# Patient Record
Sex: Female | Born: 1935 | Race: Black or African American | Hispanic: No | Smoking: Never smoker
Health system: Southern US, Community
[De-identification: ages and names within clinical notes are randomized; demographics above are authoritative.]

## PROBLEM LIST (undated history)

## (undated) DIAGNOSIS — E785 Hyperlipidemia, unspecified: Secondary | ICD-10-CM

## (undated) DIAGNOSIS — I440 Atrioventricular block, first degree: Secondary | ICD-10-CM

## (undated) DIAGNOSIS — K37 Unspecified appendicitis: Secondary | ICD-10-CM

## (undated) DIAGNOSIS — F32A Depression, unspecified: Secondary | ICD-10-CM

## (undated) DIAGNOSIS — I4892 Unspecified atrial flutter: Secondary | ICD-10-CM

## (undated) DIAGNOSIS — I495 Sick sinus syndrome: Secondary | ICD-10-CM

## (undated) DIAGNOSIS — J449 Chronic obstructive pulmonary disease, unspecified: Secondary | ICD-10-CM

## (undated) DIAGNOSIS — F0390 Unspecified dementia without behavioral disturbance: Secondary | ICD-10-CM

## (undated) DIAGNOSIS — I509 Heart failure, unspecified: Secondary | ICD-10-CM

## (undated) DIAGNOSIS — D649 Anemia, unspecified: Secondary | ICD-10-CM

## (undated) DIAGNOSIS — I10 Essential (primary) hypertension: Secondary | ICD-10-CM

## (undated) DIAGNOSIS — M069 Rheumatoid arthritis, unspecified: Secondary | ICD-10-CM

## (undated) DIAGNOSIS — N390 Urinary tract infection, site not specified: Secondary | ICD-10-CM

## (undated) DIAGNOSIS — F329 Major depressive disorder, single episode, unspecified: Secondary | ICD-10-CM

## (undated) HISTORY — PX: PEG PLACEMENT: SHX5437

## (undated) HISTORY — PX: OTHER SURGICAL HISTORY: SHX169

---

## 2014-10-08 ENCOUNTER — Ambulatory Visit: Payer: Self-pay | Admitting: Family Medicine

## 2014-10-08 LAB — BASIC METABOLIC PANEL
ANION GAP: 6 — AB (ref 7–16)
BUN: 29 mg/dL — ABNORMAL HIGH (ref 7–18)
CHLORIDE: 103 mmol/L (ref 98–107)
CO2: 31 mmol/L (ref 21–32)
Calcium, Total: 9.6 mg/dL (ref 8.5–10.1)
Creatinine: 1.61 mg/dL — ABNORMAL HIGH (ref 0.60–1.30)
EGFR (Non-African Amer.): 32 — ABNORMAL LOW
GFR CALC AF AMER: 39 — AB
Glucose: 102 mg/dL — ABNORMAL HIGH (ref 65–99)
OSMOLALITY: 285 (ref 275–301)
Potassium: 4 mmol/L (ref 3.5–5.1)
Sodium: 140 mmol/L (ref 136–145)

## 2014-10-08 LAB — CBC WITH DIFFERENTIAL/PLATELET
HCT: 36.8 % (ref 35.0–47.0)
HGB: 11.9 g/dL — AB (ref 12.0–16.0)
MCH: 28.7 pg (ref 26.0–34.0)
MCHC: 32.3 g/dL (ref 32.0–36.0)
MCV: 89 fL (ref 80–100)
PLATELETS: 212 10*3/uL (ref 150–440)
RBC: 4.14 10*6/uL (ref 3.80–5.20)
RDW: 15.8 % — AB (ref 11.5–14.5)
WBC: 16.5 10*3/uL — ABNORMAL HIGH (ref 3.6–11.0)

## 2015-01-14 ENCOUNTER — Ambulatory Visit: Payer: Self-pay

## 2015-01-14 LAB — BASIC METABOLIC PANEL
Anion Gap: 11 (ref 7–16)
BUN: 18 mg/dL (ref 7–18)
CALCIUM: 10.4 mg/dL — AB (ref 8.5–10.1)
CO2: 27 mmol/L (ref 21–32)
Chloride: 98 mmol/L (ref 98–107)
Creatinine: 1.45 mg/dL — ABNORMAL HIGH (ref 0.60–1.30)
EGFR (African American): 44 — ABNORMAL LOW
EGFR (Non-African Amer.): 36 — ABNORMAL LOW
Glucose: 123 mg/dL — ABNORMAL HIGH (ref 65–99)
Osmolality: 275 (ref 275–301)
Potassium: 3.7 mmol/L (ref 3.5–5.1)
Sodium: 136 mmol/L (ref 136–145)

## 2015-01-14 LAB — URINALYSIS, COMPLETE
BACTERIA: NONE SEEN
BLOOD: NEGATIVE
Bilirubin,UR: NEGATIVE
Glucose,UR: NEGATIVE mg/dL (ref 0–75)
Hyaline Cast: 3
Ketone: NEGATIVE
Leukocyte Esterase: NEGATIVE
Nitrite: NEGATIVE
Ph: 5 (ref 4.5–8.0)
Protein: 30
Specific Gravity: 1.014 (ref 1.003–1.030)
WBC UR: 1 /HPF (ref 0–5)

## 2015-01-16 LAB — URINE CULTURE

## 2015-01-20 ENCOUNTER — Ambulatory Visit: Payer: Self-pay

## 2015-01-20 ENCOUNTER — Emergency Department: Payer: Self-pay | Admitting: Student

## 2015-01-20 ENCOUNTER — Ambulatory Visit: Payer: Self-pay | Admitting: Cardiology

## 2015-01-20 LAB — CBC WITH DIFFERENTIAL/PLATELET
Basophil #: 0.1 10*3/uL (ref 0.0–0.1)
Basophil %: 0.5 %
Eosinophil #: 0 10*3/uL (ref 0.0–0.7)
Eosinophil %: 0.2 %
HCT: 43.6 % (ref 35.0–47.0)
HGB: 14 g/dL (ref 12.0–16.0)
Lymphocyte #: 1.3 10*3/uL (ref 1.0–3.6)
Lymphocyte %: 9.1 %
MCH: 28.1 pg (ref 26.0–34.0)
MCHC: 32.1 g/dL (ref 32.0–36.0)
MCV: 88 fL (ref 80–100)
Monocyte #: 0.9 x10 3/mm (ref 0.2–0.9)
Monocyte %: 6 %
NEUTROS PCT: 84.2 %
Neutrophil #: 12.2 10*3/uL — ABNORMAL HIGH (ref 1.4–6.5)
Platelet: 218 10*3/uL (ref 150–440)
RBC: 4.98 10*6/uL (ref 3.80–5.20)
RDW: 16.6 % — ABNORMAL HIGH (ref 11.5–14.5)
WBC: 14.4 10*3/uL — ABNORMAL HIGH (ref 3.6–11.0)

## 2015-01-20 LAB — BASIC METABOLIC PANEL
Anion Gap: 10 (ref 7–16)
BUN: 17 mg/dL (ref 7–18)
Calcium, Total: 9 mg/dL (ref 8.5–10.1)
Chloride: 100 mmol/L (ref 98–107)
Co2: 29 mmol/L (ref 21–32)
Creatinine: 1.28 mg/dL (ref 0.60–1.30)
GFR CALC AF AMER: 51 — AB
GFR CALC NON AF AMER: 42 — AB
Glucose: 126 mg/dL — ABNORMAL HIGH (ref 65–99)
Osmolality: 281 (ref 275–301)
Potassium: 3.7 mmol/L (ref 3.5–5.1)
Sodium: 139 mmol/L (ref 136–145)

## 2015-02-18 ENCOUNTER — Other Ambulatory Visit: Payer: Self-pay

## 2015-02-19 ENCOUNTER — Other Ambulatory Visit: Payer: Self-pay

## 2015-03-16 ENCOUNTER — Other Ambulatory Visit: Admit: 2015-03-16 | Disposition: A | Discharge status: Home / Self Care | Payer: Self-pay

## 2015-03-22 LAB — WOUND CULTURE

## 2015-03-28 ENCOUNTER — Other Ambulatory Visit: Admit: 2015-03-28 | Disposition: A | Discharge status: Home / Self Care | Payer: Self-pay

## 2015-03-28 LAB — BASIC METABOLIC PANEL
Anion Gap: 7 (ref 7–16)
BUN: 17 mg/dL
CREATININE: 0.77 mg/dL
Calcium, Total: 9.2 mg/dL
Chloride: 100 mmol/L — ABNORMAL LOW
Co2: 25 mmol/L
EGFR (African American): >60
EGFR (Non-African Amer.): >60
Glucose: 137 mg/dL — ABNORMAL HIGH
POTASSIUM: 3.9 mmol/L
Sodium: 132 mmol/L — ABNORMAL LOW

## 2015-03-28 LAB — HEPATIC FUNCTION PANEL A (ARMC)
ALT: 15 U/L
Albumin: 3.7 g/dL
Alkaline Phosphatase: 76 U/L
BILIRUBIN DIRECT: 0.2 mg/dL
Bilirubin,Total: 0.3 mg/dL
Indirect Bilirubin: 0.1
SGOT(AST): 20 U/L
Total Protein: 7.7 g/dL

## 2015-03-28 LAB — LIPASE, BLOOD: Lipase: 26 U/L

## 2015-03-28 LAB — AMYLASE: AMYLASE: 46 U/L

## 2015-03-29 ENCOUNTER — Other Ambulatory Visit: Admit: 2015-03-29 | Disposition: A | Discharge status: Home / Self Care | Payer: Self-pay

## 2015-03-29 LAB — CBC WITH DIFFERENTIAL/PLATELET
Basophil #: 0.1 10*3/uL (ref 0.0–0.1)
Basophil %: 1.1 %
Eosinophil #: 0.3 10*3/uL (ref 0.0–0.7)
Eosinophil %: 2.3 %
HCT: 40.9 % (ref 35.0–47.0)
HGB: 13.1 g/dL (ref 12.0–16.0)
LYMPHS ABS: 2.2 10*3/uL (ref 1.0–3.6)
Lymphocyte %: 16.8 %
MCH: 28.5 pg (ref 26.0–34.0)
MCHC: 32 g/dL (ref 32.0–36.0)
MCV: 89 fL (ref 80–100)
MONO ABS: 0.9 x10 3/mm (ref 0.2–0.9)
MONOS PCT: 6.8 %
Neutrophil #: 9.5 10*3/uL — ABNORMAL HIGH (ref 1.4–6.5)
Neutrophil %: 73 %
PLATELETS: 290 10*3/uL (ref 150–440)
RBC: 4.59 10*6/uL (ref 3.80–5.20)
RDW: 19 % — AB (ref 11.5–14.5)
WBC: 13 10*3/uL — ABNORMAL HIGH (ref 3.6–11.0)

## 2015-03-29 LAB — AMYLASE: AMYLASE: 33 U/L

## 2015-03-29 LAB — LIPASE, BLOOD: LIPASE: 23 U/L

## 2015-04-16 ENCOUNTER — Other Ambulatory Visit
Admission: RE | Admit: 2015-04-16 | Discharge: 2015-04-16 | Disposition: A | Discharge status: Home / Self Care | Payer: Medicare Other | Source: Skilled Nursing Facility | Attending: Nurse Practitioner | Admitting: Nurse Practitioner

## 2015-04-16 DIAGNOSIS — D72829 Elevated white blood cell count, unspecified: Secondary | ICD-10-CM | POA: Diagnosis present

## 2015-04-16 LAB — URINALYSIS COMPLETE WITH MICROSCOPIC (ARMC ONLY)
BILIRUBIN URINE: NEGATIVE
Bacteria, UA: NONE SEEN
Glucose, UA: NEGATIVE mg/dL
Hgb urine dipstick: NEGATIVE
KETONES UR: NEGATIVE mg/dL
Leukocytes, UA: NEGATIVE
Nitrite: NEGATIVE
PROTEIN: NEGATIVE mg/dL
RBC / HPF: NONE SEEN RBC/hpf (ref 0–5)
SPECIFIC GRAVITY, URINE: 1.008 (ref 1.005–1.030)
pH: 6 (ref 5.0–8.0)

## 2015-04-18 LAB — URINE CULTURE: CULTURE: NO GROWTH

## 2015-05-19 ENCOUNTER — Other Ambulatory Visit
Admission: RE | Admit: 2015-05-19 | Discharge: 2015-05-19 | Disposition: A | Discharge status: Home / Self Care | Payer: Medicare Other | Source: Skilled Nursing Facility | Attending: Family Medicine | Admitting: Family Medicine

## 2015-05-19 DIAGNOSIS — D72829 Elevated white blood cell count, unspecified: Secondary | ICD-10-CM | POA: Insufficient documentation

## 2015-05-19 LAB — URINALYSIS COMPLETE WITH MICROSCOPIC (ARMC ONLY)
Bilirubin Urine: NEGATIVE
Glucose, UA: NEGATIVE mg/dL
Hgb urine dipstick: NEGATIVE
Ketones, ur: NEGATIVE mg/dL
Leukocytes, UA: NEGATIVE
Nitrite: NEGATIVE
PH: 8 (ref 5.0–8.0)
PROTEIN: NEGATIVE mg/dL
Specific Gravity, Urine: 1.017 (ref 1.005–1.030)

## 2015-05-21 LAB — URINE CULTURE

## 2015-05-31 ENCOUNTER — Other Ambulatory Visit
Admission: RE | Admit: 2015-05-31 | Discharge: 2015-05-31 | Disposition: A | Discharge status: Home / Self Care | Payer: Medicare Other | Source: Other Acute Inpatient Hospital | Attending: Family Medicine | Admitting: Family Medicine

## 2015-05-31 ENCOUNTER — Other Ambulatory Visit
Admission: RE | Admit: 2015-05-31 | Discharge: 2015-05-31 | Disposition: A | Discharge status: Home / Self Care | Payer: Medicare Other | Source: Ambulatory Visit | Attending: Family Medicine | Admitting: Family Medicine

## 2015-05-31 DIAGNOSIS — D72829 Elevated white blood cell count, unspecified: Secondary | ICD-10-CM | POA: Insufficient documentation

## 2015-05-31 LAB — CBC WITH DIFFERENTIAL/PLATELET
BASOS PCT: 0 %
Basophils Absolute: 0 10*3/uL (ref 0–0.1)
Eosinophils Absolute: 0.1 10*3/uL (ref 0–0.7)
Eosinophils Relative: 0 %
HCT: 40.5 % (ref 35.0–47.0)
HEMOGLOBIN: 12.8 g/dL (ref 12.0–16.0)
Lymphocytes Relative: 9 %
Lymphs Abs: 1.2 10*3/uL (ref 1.0–3.6)
MCH: 27 pg (ref 26.0–34.0)
MCHC: 31.7 g/dL — AB (ref 32.0–36.0)
MCV: 85.1 fL (ref 80.0–100.0)
Monocytes Absolute: 1 10*3/uL — ABNORMAL HIGH (ref 0.2–0.9)
Monocytes Relative: 7 %
NEUTROS ABS: 12.4 10*3/uL — AB (ref 1.4–6.5)
NEUTROS PCT: 84 %
PLATELETS: 287 10*3/uL (ref 150–440)
RBC: 4.76 MIL/uL (ref 3.80–5.20)
RDW: 17.4 % — AB (ref 11.5–14.5)
WBC: 14.7 10*3/uL — ABNORMAL HIGH (ref 3.6–11.0)

## 2015-05-31 LAB — URINALYSIS COMPLETE WITH MICROSCOPIC (ARMC ONLY)
Bacteria, UA: NONE SEEN
Bilirubin Urine: NEGATIVE
Glucose, UA: NEGATIVE mg/dL
HGB URINE DIPSTICK: NEGATIVE
KETONES UR: NEGATIVE mg/dL
Leukocytes, UA: NEGATIVE
Nitrite: NEGATIVE
PH: 8 (ref 5.0–8.0)
Protein, ur: 100 mg/dL — AB
Specific Gravity, Urine: 1.01 (ref 1.005–1.030)

## 2015-05-31 LAB — BASIC METABOLIC PANEL
Anion gap: 9 (ref 5–15)
BUN: 23 mg/dL — AB (ref 6–20)
CO2: 23 mmol/L (ref 22–32)
Calcium: 9.2 mg/dL (ref 8.9–10.3)
Chloride: 103 mmol/L (ref 101–111)
Creatinine, Ser: 0.71 mg/dL (ref 0.44–1.00)
GFR calc non Af Amer: >60 mL/min (ref >60)
Glucose, Bld: 132 mg/dL — ABNORMAL HIGH (ref 65–99)
POTASSIUM: 4.4 mmol/L (ref 3.5–5.1)
SODIUM: 135 mmol/L (ref 135–145)

## 2015-06-02 LAB — URINE CULTURE: Culture: >=100000

## 2015-09-05 ENCOUNTER — Other Ambulatory Visit
Admission: RE | Admit: 2015-09-05 | Discharge: 2015-09-05 | Disposition: A | Discharge status: Home / Self Care | Payer: Medicare Other | Source: Skilled Nursing Facility | Attending: General Surgery | Admitting: General Surgery

## 2015-09-05 DIAGNOSIS — R000 Tachycardia, unspecified: Secondary | ICD-10-CM | POA: Insufficient documentation

## 2015-09-05 DIAGNOSIS — R509 Fever, unspecified: Secondary | ICD-10-CM | POA: Insufficient documentation

## 2015-09-05 LAB — URINALYSIS COMPLETE WITH MICROSCOPIC (ARMC ONLY)
Bilirubin Urine: NEGATIVE
Glucose, UA: NEGATIVE mg/dL
Hgb urine dipstick: NEGATIVE
Ketones, ur: NEGATIVE mg/dL
Nitrite: NEGATIVE
PH: 5 (ref 5.0–8.0)
PROTEIN: NEGATIVE mg/dL
Specific Gravity, Urine: 1.009 (ref 1.005–1.030)

## 2015-09-07 ENCOUNTER — Other Ambulatory Visit
Admission: RE | Admit: 2015-09-07 | Discharge: 2015-09-07 | Disposition: A | Discharge status: Home / Self Care | Payer: Medicare Other | Source: Ambulatory Visit | Attending: Family Medicine | Admitting: Family Medicine

## 2015-09-07 DIAGNOSIS — K5669 Other intestinal obstruction: Secondary | ICD-10-CM | POA: Insufficient documentation

## 2015-09-07 DIAGNOSIS — K567 Ileus, unspecified: Secondary | ICD-10-CM | POA: Diagnosis present

## 2015-09-07 DIAGNOSIS — K5660 Unspecified intestinal obstruction: Secondary | ICD-10-CM | POA: Insufficient documentation

## 2015-09-07 DIAGNOSIS — I495 Sick sinus syndrome: Secondary | ICD-10-CM | POA: Diagnosis not present

## 2015-09-07 LAB — COMPREHENSIVE METABOLIC PANEL
ALT: 28 U/L (ref 14–54)
ANION GAP: 12 (ref 5–15)
AST: 35 U/L (ref 15–41)
Albumin: 3.3 g/dL — ABNORMAL LOW (ref 3.5–5.0)
Alkaline Phosphatase: 88 U/L (ref 38–126)
BUN: 34 mg/dL — ABNORMAL HIGH (ref 6–20)
CHLORIDE: 97 mmol/L — AB (ref 101–111)
CO2: 24 mmol/L (ref 22–32)
Calcium: 9.1 mg/dL (ref 8.9–10.3)
Creatinine, Ser: 0.78 mg/dL (ref 0.44–1.00)
GFR calc non Af Amer: >60 mL/min (ref >60)
Glucose, Bld: 107 mg/dL — ABNORMAL HIGH (ref 65–99)
POTASSIUM: 4.7 mmol/L (ref 3.5–5.1)
SODIUM: 133 mmol/L — AB (ref 135–145)
Total Bilirubin: 0.9 mg/dL (ref 0.3–1.2)
Total Protein: 7.6 g/dL (ref 6.5–8.1)

## 2015-09-07 LAB — URINE CULTURE: Culture: >=100000

## 2015-11-17 ENCOUNTER — Other Ambulatory Visit
Admission: RE | Admit: 2015-11-17 | Discharge: 2015-11-17 | Disposition: A | Discharge status: Home / Self Care | Payer: Medicare Other | Source: Skilled Nursing Facility | Attending: Family Medicine | Admitting: Family Medicine

## 2015-11-17 DIAGNOSIS — Z029 Encounter for administrative examinations, unspecified: Secondary | ICD-10-CM | POA: Diagnosis present

## 2015-11-17 LAB — BASIC METABOLIC PANEL
Anion gap: 9 (ref 5–15)
BUN: 33 mg/dL — ABNORMAL HIGH (ref 6–20)
CO2: 29 mmol/L (ref 22–32)
CREATININE: 0.84 mg/dL (ref 0.44–1.00)
Calcium: 9.1 mg/dL (ref 8.9–10.3)
Chloride: 96 mmol/L — ABNORMAL LOW (ref 101–111)
GFR calc Af Amer: >60 mL/min (ref >60)
GFR calc non Af Amer: >60 mL/min (ref >60)
Glucose, Bld: 163 mg/dL — ABNORMAL HIGH (ref 65–99)
Potassium: 3.5 mmol/L (ref 3.5–5.1)
Sodium: 134 mmol/L — ABNORMAL LOW (ref 135–145)

## 2015-11-17 LAB — CBC WITH DIFFERENTIAL/PLATELET
BASOS PCT: 1 %
Basophils Absolute: 0.1 10*3/uL (ref 0–0.1)
EOS PCT: 1 %
Eosinophils Absolute: 0.1 10*3/uL (ref 0–0.7)
HEMATOCRIT: 42.6 % (ref 35.0–47.0)
Hemoglobin: 13.9 g/dL (ref 12.0–16.0)
Lymphocytes Relative: 9 %
Lymphs Abs: 1 10*3/uL (ref 1.0–3.6)
MCH: 27.5 pg (ref 26.0–34.0)
MCHC: 32.6 g/dL (ref 32.0–36.0)
MCV: 84.5 fL (ref 80.0–100.0)
MONO ABS: 0.9 10*3/uL (ref 0.2–0.9)
MONOS PCT: 8 %
NEUTROS PCT: 81 %
Neutro Abs: 9.3 10*3/uL — ABNORMAL HIGH (ref 1.4–6.5)
Platelets: 270 10*3/uL (ref 150–440)
RBC: 5.04 MIL/uL (ref 3.80–5.20)
RDW: 16.5 % — AB (ref 11.5–14.5)
WBC: 11.3 10*3/uL — ABNORMAL HIGH (ref 3.6–11.0)

## 2016-02-01 ENCOUNTER — Other Ambulatory Visit
Admission: RE | Admit: 2016-02-01 | Discharge: 2016-02-01 | Disposition: A | Discharge status: Home / Self Care | Payer: Medicare Other | Source: Ambulatory Visit | Attending: Family Medicine | Admitting: Family Medicine

## 2016-02-01 DIAGNOSIS — R112 Nausea with vomiting, unspecified: Secondary | ICD-10-CM | POA: Insufficient documentation

## 2016-02-01 LAB — URINALYSIS COMPLETE WITH MICROSCOPIC (ARMC ONLY)
BILIRUBIN URINE: NEGATIVE
GLUCOSE, UA: NEGATIVE mg/dL
Hgb urine dipstick: NEGATIVE
Ketones, ur: NEGATIVE mg/dL
Nitrite: NEGATIVE
Protein, ur: 100 mg/dL — AB
SPECIFIC GRAVITY, URINE: 1.021 (ref 1.005–1.030)
pH: 6 (ref 5.0–8.0)

## 2016-02-01 LAB — COMPREHENSIVE METABOLIC PANEL
ALK PHOS: 104 U/L (ref 38–126)
ALT: 36 U/L (ref 14–54)
ANION GAP: 7 (ref 5–15)
AST: 33 U/L (ref 15–41)
Albumin: 3.2 g/dL — ABNORMAL LOW (ref 3.5–5.0)
BILIRUBIN TOTAL: 0.8 mg/dL (ref 0.3–1.2)
BUN: 28 mg/dL — ABNORMAL HIGH (ref 6–20)
CALCIUM: 8.8 mg/dL — AB (ref 8.9–10.3)
CO2: 25 mmol/L (ref 22–32)
Chloride: 102 mmol/L (ref 101–111)
Creatinine, Ser: 0.74 mg/dL (ref 0.44–1.00)
GFR calc non Af Amer: >60 mL/min (ref >60)
GLUCOSE: 151 mg/dL — AB (ref 65–99)
POTASSIUM: 4 mmol/L (ref 3.5–5.1)
SODIUM: 134 mmol/L — AB (ref 135–145)
TOTAL PROTEIN: 7.5 g/dL (ref 6.5–8.1)

## 2016-02-01 LAB — CBC WITH DIFFERENTIAL/PLATELET
Basophils Absolute: 0 10*3/uL (ref 0–0.1)
Basophils Relative: 0 %
EOS ABS: 0 10*3/uL (ref 0–0.7)
EOS PCT: 0 %
HCT: 45.5 % (ref 35.0–47.0)
Hemoglobin: 15 g/dL (ref 12.0–16.0)
LYMPHS ABS: 1.1 10*3/uL (ref 1.0–3.6)
Lymphocytes Relative: 8 %
MCH: 27.7 pg (ref 26.0–34.0)
MCHC: 33 g/dL (ref 32.0–36.0)
MCV: 84 fL (ref 80.0–100.0)
MONO ABS: 1 10*3/uL — AB (ref 0.2–0.9)
Monocytes Relative: 7 %
Neutro Abs: 12.7 10*3/uL — ABNORMAL HIGH (ref 1.4–6.5)
Neutrophils Relative %: 85 %
PLATELETS: 245 10*3/uL (ref 150–440)
RBC: 5.42 MIL/uL — ABNORMAL HIGH (ref 3.80–5.20)
RDW: 17.7 % — ABNORMAL HIGH (ref 11.5–14.5)
WBC: 14.9 10*3/uL — ABNORMAL HIGH (ref 3.6–11.0)

## 2016-02-05 LAB — URINE CULTURE: SPECIAL REQUESTS: NORMAL

## 2016-05-15 ENCOUNTER — Emergency Department: Payer: Medicare Other

## 2016-05-15 ENCOUNTER — Encounter: Payer: Self-pay | Admitting: Internal Medicine

## 2016-05-15 ENCOUNTER — Inpatient Hospital Stay
Admission: EM | Admit: 2016-05-15 | Discharge: 2016-06-12 | DRG: 268 | Disposition: E | Payer: Medicare Other | Attending: Internal Medicine | Admitting: Internal Medicine

## 2016-05-15 DIAGNOSIS — Z66 Do not resuscitate: Secondary | ICD-10-CM | POA: Diagnosis present

## 2016-05-15 DIAGNOSIS — E876 Hypokalemia: Secondary | ICD-10-CM | POA: Diagnosis not present

## 2016-05-15 DIAGNOSIS — I248 Other forms of acute ischemic heart disease: Secondary | ICD-10-CM | POA: Diagnosis present

## 2016-05-15 DIAGNOSIS — L8990 Pressure ulcer of unspecified site, unspecified stage: Secondary | ICD-10-CM | POA: Insufficient documentation

## 2016-05-15 DIAGNOSIS — M069 Rheumatoid arthritis, unspecified: Secondary | ICD-10-CM | POA: Diagnosis present

## 2016-05-15 DIAGNOSIS — R112 Nausea with vomiting, unspecified: Secondary | ICD-10-CM | POA: Diagnosis not present

## 2016-05-15 DIAGNOSIS — Z7952 Long term (current) use of systemic steroids: Secondary | ICD-10-CM

## 2016-05-15 DIAGNOSIS — K254 Chronic or unspecified gastric ulcer with hemorrhage: Secondary | ICD-10-CM | POA: Diagnosis present

## 2016-05-15 DIAGNOSIS — Z931 Gastrostomy status: Secondary | ICD-10-CM | POA: Diagnosis not present

## 2016-05-15 DIAGNOSIS — N179 Acute kidney failure, unspecified: Secondary | ICD-10-CM | POA: Diagnosis present

## 2016-05-15 DIAGNOSIS — R059 Cough, unspecified: Secondary | ICD-10-CM

## 2016-05-15 DIAGNOSIS — J449 Chronic obstructive pulmonary disease, unspecified: Secondary | ICD-10-CM | POA: Diagnosis present

## 2016-05-15 DIAGNOSIS — E1122 Type 2 diabetes mellitus with diabetic chronic kidney disease: Secondary | ICD-10-CM | POA: Diagnosis present

## 2016-05-15 DIAGNOSIS — I713 Abdominal aortic aneurysm, ruptured: Secondary | ICD-10-CM | POA: Diagnosis present

## 2016-05-15 DIAGNOSIS — N183 Chronic kidney disease, stage 3 (moderate): Secondary | ICD-10-CM | POA: Diagnosis present

## 2016-05-15 DIAGNOSIS — E440 Moderate protein-calorie malnutrition: Secondary | ICD-10-CM

## 2016-05-15 DIAGNOSIS — R109 Unspecified abdominal pain: Secondary | ICD-10-CM

## 2016-05-15 DIAGNOSIS — I509 Heart failure, unspecified: Secondary | ICD-10-CM | POA: Diagnosis present

## 2016-05-15 DIAGNOSIS — D62 Acute posthemorrhagic anemia: Secondary | ICD-10-CM | POA: Diagnosis present

## 2016-05-15 DIAGNOSIS — I130 Hypertensive heart and chronic kidney disease with heart failure and stage 1 through stage 4 chronic kidney disease, or unspecified chronic kidney disease: Secondary | ICD-10-CM | POA: Diagnosis present

## 2016-05-15 DIAGNOSIS — R1310 Dysphagia, unspecified: Secondary | ICD-10-CM | POA: Diagnosis present

## 2016-05-15 DIAGNOSIS — K567 Ileus, unspecified: Secondary | ICD-10-CM | POA: Diagnosis not present

## 2016-05-15 DIAGNOSIS — I959 Hypotension, unspecified: Secondary | ICD-10-CM | POA: Diagnosis not present

## 2016-05-15 DIAGNOSIS — K920 Hematemesis: Secondary | ICD-10-CM

## 2016-05-15 DIAGNOSIS — Z79899 Other long term (current) drug therapy: Secondary | ICD-10-CM

## 2016-05-15 DIAGNOSIS — Z87440 Personal history of urinary (tract) infections: Secondary | ICD-10-CM

## 2016-05-15 DIAGNOSIS — R05 Cough: Secondary | ICD-10-CM

## 2016-05-15 DIAGNOSIS — I4892 Unspecified atrial flutter: Secondary | ICD-10-CM | POA: Diagnosis present

## 2016-05-15 DIAGNOSIS — K922 Gastrointestinal hemorrhage, unspecified: Secondary | ICD-10-CM | POA: Diagnosis present

## 2016-05-15 DIAGNOSIS — K2971 Gastritis, unspecified, with bleeding: Secondary | ICD-10-CM | POA: Diagnosis present

## 2016-05-15 DIAGNOSIS — E785 Hyperlipidemia, unspecified: Secondary | ICD-10-CM | POA: Diagnosis present

## 2016-05-15 DIAGNOSIS — Z7401 Bed confinement status: Secondary | ICD-10-CM | POA: Diagnosis not present

## 2016-05-15 DIAGNOSIS — E860 Dehydration: Secondary | ICD-10-CM | POA: Diagnosis present

## 2016-05-15 DIAGNOSIS — D649 Anemia, unspecified: Secondary | ICD-10-CM | POA: Diagnosis not present

## 2016-05-15 DIAGNOSIS — R571 Hypovolemic shock: Secondary | ICD-10-CM | POA: Diagnosis present

## 2016-05-15 DIAGNOSIS — F0390 Unspecified dementia without behavioral disturbance: Secondary | ICD-10-CM | POA: Diagnosis present

## 2016-05-15 DIAGNOSIS — R140 Abdominal distension (gaseous): Secondary | ICD-10-CM

## 2016-05-15 DIAGNOSIS — E782 Mixed hyperlipidemia: Secondary | ICD-10-CM | POA: Diagnosis present

## 2016-05-15 DIAGNOSIS — A419 Sepsis, unspecified organism: Secondary | ICD-10-CM

## 2016-05-15 DIAGNOSIS — E872 Acidosis: Secondary | ICD-10-CM | POA: Diagnosis present

## 2016-05-15 HISTORY — DX: Unspecified dementia, unspecified severity, without behavioral disturbance, psychotic disturbance, mood disturbance, and anxiety: F03.90

## 2016-05-15 HISTORY — DX: Hyperlipidemia, unspecified: E78.5

## 2016-05-15 HISTORY — DX: Chronic obstructive pulmonary disease, unspecified: J44.9

## 2016-05-15 HISTORY — DX: Depression, unspecified: F32.A

## 2016-05-15 HISTORY — DX: Sick sinus syndrome: I49.5

## 2016-05-15 HISTORY — DX: Unspecified atrial flutter: I48.92

## 2016-05-15 HISTORY — DX: Atrioventricular block, first degree: I44.0

## 2016-05-15 HISTORY — DX: Urinary tract infection, site not specified: N39.0

## 2016-05-15 HISTORY — DX: Unspecified appendicitis: K37

## 2016-05-15 HISTORY — DX: Major depressive disorder, single episode, unspecified: F32.9

## 2016-05-15 HISTORY — DX: Rheumatoid arthritis, unspecified: M06.9

## 2016-05-15 HISTORY — DX: Essential (primary) hypertension: I10

## 2016-05-15 HISTORY — DX: Heart failure, unspecified: I50.9

## 2016-05-15 HISTORY — DX: Anemia, unspecified: D64.9

## 2016-05-15 LAB — COMPREHENSIVE METABOLIC PANEL
ALBUMIN: 2.9 g/dL — AB (ref 3.5–5.0)
ALK PHOS: 74 U/L (ref 38–126)
ALT: 17 U/L (ref 14–54)
ANION GAP: 16 — AB (ref 5–15)
AST: 30 U/L (ref 15–41)
BILIRUBIN TOTAL: 0.8 mg/dL (ref 0.3–1.2)
BUN: 53 mg/dL — ABNORMAL HIGH (ref 6–20)
CALCIUM: 9 mg/dL (ref 8.9–10.3)
CO2: 23 mmol/L (ref 22–32)
Chloride: 96 mmol/L — ABNORMAL LOW (ref 101–111)
Creatinine, Ser: 1.43 mg/dL — ABNORMAL HIGH (ref 0.44–1.00)
GFR, EST AFRICAN AMERICAN: 37 mL/min — AB (ref >60)
GFR, EST NON AFRICAN AMERICAN: 32 mL/min — AB (ref >60)
GLUCOSE: 171 mg/dL — AB (ref 65–99)
POTASSIUM: 4.2 mmol/L (ref 3.5–5.1)
Sodium: 135 mmol/L (ref 135–145)
TOTAL PROTEIN: 6.8 g/dL (ref 6.5–8.1)

## 2016-05-15 LAB — CBC WITH DIFFERENTIAL/PLATELET
Basophils Absolute: 0.1 10*3/uL (ref 0–0.1)
Eosinophils Absolute: 0 10*3/uL (ref 0–0.7)
Eosinophils Relative: 0 %
HEMATOCRIT: 32.4 % — AB (ref 35.0–47.0)
HEMOGLOBIN: 10.3 g/dL — AB (ref 12.0–16.0)
LYMPHS ABS: 0.8 10*3/uL — AB (ref 1.0–3.6)
MCH: 27.7 pg (ref 26.0–34.0)
MCHC: 31.8 g/dL — AB (ref 32.0–36.0)
MCV: 87 fL (ref 80.0–100.0)
MONO ABS: 1.4 10*3/uL — AB (ref 0.2–0.9)
NEUTROS ABS: 27.4 10*3/uL — AB (ref 1.4–6.5)
Neutrophils Relative %: 92 %
Platelets: 251 10*3/uL (ref 150–440)
RBC: 3.73 MIL/uL — ABNORMAL LOW (ref 3.80–5.20)
RDW: 18.3 % — AB (ref 11.5–14.5)
WBC: 29.6 10*3/uL — AB (ref 3.6–11.0)

## 2016-05-15 LAB — URINALYSIS COMPLETE WITH MICROSCOPIC (ARMC ONLY)
Bilirubin Urine: NEGATIVE
Glucose, UA: NEGATIVE mg/dL
Hgb urine dipstick: NEGATIVE
Ketones, ur: NEGATIVE mg/dL
Nitrite: NEGATIVE
PH: 5 (ref 5.0–8.0)
PROTEIN: 30 mg/dL — AB
Specific Gravity, Urine: 1.013 (ref 1.005–1.030)

## 2016-05-15 LAB — PROCALCITONIN: Procalcitonin: 2.71 ng/mL

## 2016-05-15 LAB — APTT: APTT: 25 s (ref 24–36)

## 2016-05-15 LAB — HEMOGLOBIN
Hemoglobin: 9.7 g/dL — ABNORMAL LOW (ref 12.0–16.0)
Hemoglobin: 8.5 g/dL — ABNORMAL LOW (ref 12.0–16.0)

## 2016-05-15 LAB — LACTIC ACID, PLASMA
Lactic Acid, Venous: 5.9 mmol/L (ref 0.5–2.0)
Lactic Acid, Venous: 5.1 mmol/L (ref 0.5–2.0)

## 2016-05-15 LAB — GLUCOSE, CAPILLARY: Glucose-Capillary: 109 mg/dL — ABNORMAL HIGH (ref 65–99)

## 2016-05-15 LAB — TROPONIN I
TROPONIN I: 0.11 ng/mL — AB (ref <0.031)
TROPONIN I: 0.29 ng/mL — AB (ref <0.031)

## 2016-05-15 LAB — PROTIME-INR
INR: 1.16
Prothrombin Time: 15 seconds (ref 11.4–15.0)

## 2016-05-15 LAB — MRSA PCR SCREENING: MRSA by PCR: NEGATIVE

## 2016-05-15 LAB — MAGNESIUM: Magnesium: 2.4 mg/dL (ref 1.7–2.4)

## 2016-05-15 MED ORDER — DIGOXIN 0.25 MG/ML IJ SOLN
0.2500 mg | Freq: Once | INTRAMUSCULAR | Status: AC
  Administered 2016-05-15: 0.25 mg via INTRAVENOUS
  Filled 2016-05-15: qty 2

## 2016-05-15 MED ORDER — SODIUM CHLORIDE 0.9 % IV BOLUS (SEPSIS)
500.0000 mL | Freq: Once | INTRAVENOUS | Status: AC
  Administered 2016-05-15: 500 mL via INTRAVENOUS

## 2016-05-15 MED ORDER — SODIUM CHLORIDE 0.9 % IV BOLUS (SEPSIS)
1000.0000 mL | Freq: Once | INTRAVENOUS | Status: AC
  Administered 2016-05-15: 1000 mL via INTRAVENOUS

## 2016-05-15 MED ORDER — ONDANSETRON HCL 4 MG/2ML IJ SOLN: 4.0000 mg | Freq: Four times a day (QID) | INTRAMUSCULAR | Status: DC | PRN

## 2016-05-15 MED ORDER — PIPERACILLIN-TAZOBACTAM 3.375 G IVPB
3.3750 g | Freq: Three times a day (TID) | INTRAVENOUS | Status: DC
  Administered 2016-05-15 – 2016-05-22 (×20): 3.375 g via INTRAVENOUS
  Filled 2016-05-15 (×26): qty 50

## 2016-05-15 MED ORDER — METOPROLOL TARTRATE 25 MG PO TABS
12.5000 mg | ORAL_TABLET | Freq: Three times a day (TID) | ORAL | Status: DC
  Filled 2016-05-15: qty 1

## 2016-05-15 MED ORDER — VANCOMYCIN HCL IN DEXTROSE 1-5 GM/200ML-% IV SOLN: 1000.0000 mg | Freq: Once | INTRAVENOUS | Status: DC

## 2016-05-15 MED ORDER — SODIUM CHLORIDE 0.9 % IV SOLN
INTRAVENOUS | Status: DC
  Administered 2016-05-15 – 2016-05-22 (×8): via INTRAVENOUS

## 2016-05-15 MED ORDER — ONDANSETRON HCL 4 MG PO TABS: 4.0000 mg | ORAL_TABLET | Freq: Four times a day (QID) | ORAL | Status: DC | PRN

## 2016-05-15 MED ORDER — DILTIAZEM HCL 25 MG/5ML IV SOLN: 15.0000 mg | Freq: Once | INTRAVENOUS | Status: DC

## 2016-05-15 MED ORDER — DILTIAZEM HCL 25 MG/5ML IV SOLN
10.0000 mg | Freq: Once | INTRAVENOUS | Status: AC
  Administered 2016-05-15: 10 mg via INTRAVENOUS
  Filled 2016-05-15: qty 5

## 2016-05-15 MED ORDER — PIPERACILLIN-TAZOBACTAM 3.375 G IVPB 30 MIN: 3.3750 g | Freq: Once | INTRAVENOUS | Status: DC

## 2016-05-15 MED ORDER — SODIUM CHLORIDE 0.9% FLUSH
3.0000 mL | Freq: Two times a day (BID) | INTRAVENOUS | Status: DC
  Administered 2016-05-15 – 2016-05-22 (×14): 3 mL via INTRAVENOUS

## 2016-05-15 MED ORDER — ONDANSETRON HCL 4 MG/2ML IJ SOLN
4.0000 mg | Freq: Once | INTRAMUSCULAR | Status: AC
  Administered 2016-05-15: 4 mg via INTRAVENOUS

## 2016-05-15 MED ORDER — ALBUTEROL SULFATE (2.5 MG/3ML) 0.083% IN NEBU: 2.5000 mg | INHALATION_SOLUTION | RESPIRATORY_TRACT | Status: DC | PRN

## 2016-05-15 MED ORDER — ONDANSETRON HCL 4 MG/2ML IJ SOLN
4.0000 mg | Freq: Four times a day (QID) | INTRAMUSCULAR | Status: DC
  Administered 2016-05-15 – 2016-05-22 (×26): 4 mg via INTRAVENOUS
  Filled 2016-05-15 (×26): qty 2

## 2016-05-15 MED ORDER — VANCOMYCIN HCL IN DEXTROSE 1-5 GM/200ML-% IV SOLN
1000.0000 mg | Freq: Once | INTRAVENOUS | Status: AC
  Administered 2016-05-15: 1000 mg via INTRAVENOUS
  Filled 2016-05-15: qty 200

## 2016-05-15 MED ORDER — SODIUM CHLORIDE 0.9 % IV SOLN
8.0000 mg/h | INTRAVENOUS | Status: DC
  Administered 2016-05-15 – 2016-05-17 (×4): 8 mg/h via INTRAVENOUS
  Filled 2016-05-15 (×6): qty 80

## 2016-05-15 MED ORDER — METOPROLOL TARTRATE 25 MG PO TABS: 12.5000 mg | ORAL_TABLET | Freq: Three times a day (TID) | ORAL | Status: DC

## 2016-05-15 MED ORDER — ACETAMINOPHEN 650 MG RE SUPP: 650.0000 mg | Freq: Four times a day (QID) | RECTAL | Status: DC | PRN

## 2016-05-15 MED ORDER — NOREPINEPHRINE BITARTRATE 1 MG/ML IV SOLN: 0.0000 ug/min | INTRAVENOUS | Status: DC

## 2016-05-15 MED ORDER — ONDANSETRON HCL 4 MG/2ML IJ SOLN
INTRAMUSCULAR | Status: AC
  Administered 2016-05-15: 4 mg via INTRAVENOUS
  Filled 2016-05-15: qty 2

## 2016-05-15 MED ORDER — ACETAMINOPHEN 325 MG PO TABS: 650.0000 mg | ORAL_TABLET | Freq: Four times a day (QID) | ORAL | Status: DC | PRN

## 2016-05-15 MED ORDER — SODIUM CHLORIDE 0.9 % IV BOLUS (SEPSIS)
1000.0000 mL | Freq: Once | INTRAVENOUS | Status: AC
Start: 2016-05-15 — End: 2016-05-15
  Administered 2016-05-15: 1000 mL via INTRAVENOUS

## 2016-05-15 MED ORDER — SODIUM CHLORIDE 0.9 % IV SOLN
80.0000 mg | Freq: Once | INTRAVENOUS | Status: AC
  Administered 2016-05-15: 80 mg via INTRAVENOUS
  Filled 2016-05-15: qty 80

## 2016-05-15 MED ORDER — DILTIAZEM HCL 25 MG/5ML IV SOLN
INTRAVENOUS | Status: AC
  Administered 2016-05-15: 10 mg via INTRAVENOUS
  Filled 2016-05-15: qty 5

## 2016-05-15 NOTE — ED Notes (Signed)
Pt presents from [facility] with c/o vomiting blood. Per EMS pt had 1 episode en route, pt presents with dark brown vomit noted to a towel and to the gown she is wearing.

## 2016-05-15 NOTE — Progress Notes (Signed)
Notified Dr. Katrinka Blazing with Amanda Mcdowell that patient's Lactic acid is 5.9, hgb 9.7, troponin .29.  Patient resting comfortably - Vitals stable.  Order to recheck lactic acid in 2 hrs.

## 2016-05-15 NOTE — ED Notes (Signed)
Dr. Imogene Burn paged due to pts BP and HR, orders received to give digoxin At this time

## 2016-05-15 NOTE — ED Notes (Signed)
MD to bedside upon patient arrival due to patient HR.

## 2016-05-15 NOTE — ED Provider Notes (Signed)
[facility] Emergency Department Provider Note   ____________________________________________  Time seen: Approximately 12:50 PM  I have reviewed the triage vital signs and the nursing notes.   HISTORY  Chief Complaint Hematemesis  Caveat-history of present illness review of systems noted due to the patient's dementia. Information is obtained from EMS on arrival.  HPI Amanda Mcdowell is a 82 y.o. female with history of dementia, hypertension, hyperlipidemia, history of bowel obstructions who presents for evaluation of hematemesis today gradual onset, constant. Per EMS, she arrives from [facility] after vomiting blood today. She has been complaining of some abdominal pain today. No other history is available.  PMH: dementia, HTN, hyperlipidemia Patient Active Problem List   Diagnosis Date Noted  . GIB (gastrointestinal bleeding) 05/14/2016  . Anemia 06/09/2016  . Atrial flutter (HCC) 05/25/2016  . Sepsis (HCC) 05/29/2016    Past Surgical History  Procedure Laterality Date  . Peg placement    . Bowel obstruction surgery      Current Outpatient Rx  Name  Route  Sig  Dispense  Refill  . acetaminophen (TYLENOL) 325 MG tablet   Gastric Tube   650 mg by Gastric Tube route every 6 (six) hours as needed for mild pain or moderate pain. *Note Dose*         . acetaminophen (TYLENOL) 650 MG suppository   Rectal   Place 650 mg rectally every 4 (four) hours as needed for fever. *Take for a temperature 100 or greater*         . albuterol (PROVENTIL HFA;VENTOLIN HFA) 108 (90 Base) MCG/ACT inhaler   Inhalation   Inhale 2 puffs into the lungs every 6 (six) hours as needed for wheezing. *Note dose*         . bisacodyl (DULCOLAX) 10 MG suppository   Rectal   Place 10 mg rectally daily as needed for moderate constipation. PER MAR give if resident starts vomiting.         . bisacodyl (DULCOLAX) 10 MG suppository   Rectal   Place 10 mg rectally every  Monday, Wednesday, and Friday. *Hold for loose stools*         . cholecalciferol (VITAMIN D) 1000 units tablet   Gastric Tube   1,000 Units by Gastric Tube route daily.         . clotrimazole-betamethasone (LOTRISONE) cream   Topical   Apply 1 application topically 3 (three) times daily. Apply to rash on back til resolved.         . cyanocobalamin (,VITAMIN B-12,) 1000 MCG/ML injection   Intramuscular   Inject 1,000 mcg into the muscle every 30 (thirty) days.         Marland Kitchen escitalopram (LEXAPRO) 10 MG tablet   Gastric Tube   10 mg by Gastric Tube route daily.         Marland Kitchen linaclotide (LINZESS) 290 MCG CAPS capsule   Gastric Tube   290 mcg by Gastric Tube route daily. *Mix with 30 ml of water*         . lisinopril (PRINIVIL,ZESTRIL) 2.5 MG tablet   Gastric Tube   2.5 mg by Gastric Tube route daily.         . magnesium oxide (MAG-OX) 400 MG tablet   Gastric Tube   400 mg by Gastric Tube route daily.         . metoprolol (LOPRESSOR) 50 MG tablet   Gastric Tube   50 mg by Gastric Tube route every 6 (  six) hours. *hold for HR < 56*         . omeprazole (PRILOSEC OTC) 20 MG tablet   Gastric Tube   20 mg by Gastric Tube route daily.         . predniSONE (DELTASONE) 5 MG tablet   Gastric Tube   5 mg by Gastric Tube route daily.         . promethazine (PHENERGAN) 25 MG/ML injection   Intramuscular   Inject 25 mg into the muscle once. Give If resident starts vomiting; hold TF x 4 hrs, and give dulcolax suppository.         . Skin Protectants, Misc. (EUCERIN) cream   Topical   Apply 1 application topically 2 (two) times daily. *Apply to lower extremities and feet*         . traMADol (ULTRAM) 50 MG tablet   Gastric Tube   25 mg by Gastric Tube route every 4 (four) hours. Take for breakthrough pain. *Note Dose*         . Wheat Dextrin (BENEFIBER) POWD   Gastric Tube   5 mLs by Gastric Tube route daily. *Mix with 4-8 oz of water.*            Allergies Atenolol  Family History  Problem Relation Age of Onset  . Family history unknown: Yes    Social History Social History  Substance Use Topics  . Smoking status: Never Smoker   . Smokeless tobacco: Not on file  . Alcohol Use: No    Review of Systems  Caveat-history of present illness review of systems noted due to the patient's dementia. Information is obtained from EMS on arrival. ____________________________________________   PHYSICAL EXAM:  VITAL SIGNS: ED Triage Vitals  Enc Vitals Group     BP 05/30/2016 1237 143/63 mmHg     Pulse Rate 06/11/2016 1237 153     Resp 06/01/2016 1237 26     Temp 05/28/2016 1237 97.5 F (36.4 C)     Temp Source 05/19/2016 1237 Axillary     SpO2 05/23/2016 1237 94 %     Weight 05/16/2016 1237 180 lb (81.647 kg)     Height 05/26/2016 1237 5\' 6"  (1.676 m)     Head Cir --      Peak Flow --      Pain Score --      Pain Loc --      Pain Edu? --      Excl. in GC? --     Constitutional: Alert and disoriented, answers simple questions with short answers. Appears fatigued but in no acute distress. Eyes: Conjunctivae are normal. PERRL. EOMI. Head: Atraumatic. Nose: No congestion/rhinnorhea. Mouth/Throat: Mucous membranes moist. Dried blood in the corners of her mouth.  Oropharynx non-erythematous. Neck: No stridor.  Supple without meningismus. Cardiovascular: Tachycardic rate, regular rhythm. Grossly normal heart sounds.  Good peripheral circulation. Respiratory: Normal respiratory effort.  No retractions. Lungs CTAB. Gastrointestinal: Mild tenderness to palpation. PEG tube in left abdomen without any erythema, warmth or drainage. No CVA tenderness. Genitourinary: Exam of the left abdomen, mild diffuse tenderness to palpation without rebound or guarding. Musculoskeletal: No lower extremity tenderness nor edema.  No joint effusions. Neurologic:  Normal speech and language. No gross focal neurologic deficits are appreciated. Skin:  Skin is  warm, dry and intact. No rash noted. Psychiatric: Mood and affect are normal. Speech and behavior are normal.  ____________________________________________   LABS (all labs ordered are listed, but only abnormal results are  displayed)  Labs Reviewed  CBC WITH DIFFERENTIAL/PLATELET - Abnormal; Notable for the following:    WBC 29.6 (*)    RBC 3.73 (*)    Hemoglobin 10.3 (*)    HCT 32.4 (*)    MCHC 31.8 (*)    RDW 18.3 (*)    Neutro Abs 27.4 (*)    Lymphs Abs 0.8 (*)    Monocytes Absolute 1.4 (*)    All other components within normal limits  COMPREHENSIVE METABOLIC PANEL - Abnormal; Notable for the following:    Chloride 96 (*)    Glucose, Bld 171 (*)    BUN 53 (*)    Creatinine, Ser 1.43 (*)    Albumin 2.9 (*)    GFR calc non Af Amer 32 (*)    GFR calc Af Amer 37 (*)    Anion gap 16 (*)    All other components within normal limits  TROPONIN I - Abnormal; Notable for the following:    Troponin I 0.11 (*)    All other components within normal limits  PROTIME-INR  APTT  TYPE AND SCREEN   ____________________________________________  EKG  ED ECG REPORT I, Gayla Doss, the attending physician, personally viewed and interpreted this ECG.   Date: 06-Jun-2016  EKG Time: 12:25  Rate: 153  Rhythm: Atrial flutter with rapid ventricular rate at 153 bpm  Axis: normal  Intervals:none  ST&T Change: No acute ST elevation  ____________________________________________  RADIOLOGY  CXR IMPRESSION: Left base atelectasis. Mild cardiomegaly.   Abdominal plain films  IMPRESSION: Nonspecific bowel gas pattern without evidence for obstruction. Large stool burden in the right colon.  ____________________________________________   PROCEDURES  Procedure(s) performed: None  Critical Care performed: Yes, see critical care note(s). Total critical care time spent 40 minutes.  ____________________________________________   INITIAL IMPRESSION / ASSESSMENT AND PLAN / ED  COURSE  Pertinent labs & imaging results that were available during my care of the patient were reviewed by me and considered in my medical decision making (see chart for details).  Amanda Mcdowell is a 82 y.o. female with history of dementia, hypertension, hyperlipidemia, history of bowel obstructions who presents for evaluation of hematemesis today. On exam, she is tachycardic, with heart rate of 153, EKG is concerning for likely atrial flutter with 2-1 AV conduction delay. Dr. Gwen Pounds of cardiology has also reviewed the EKG and agrees. She has actively vomited  100 cc of dark blood here in my presence her blood pressure is stable. We'll obtain labs, give IV fluids, Cardizem, Protonix and Protonix drip and anticipate admission.   ----------------------------------------- 1:53 PM on 06/06/2016 -----------------------------------------  labs reviewed. White blood cell count is elevated at 29,000, hemoglobin is 10.3, baseline appears to be closer to 15. Troponin mildly elevated 0.11. Patient with minimal improvement of her heart rate after Cardizem, her systolic blood pressure did dip down into the 80s however this is responsive to IV fluids. Continue IV hydration. Discussed the case with the hospitalist for admission. I also discussed the case with Dr. Jerre Simon of GI and he will evaluate the patient when she arrives upstairs.  ____________________________________________   FINAL CLINICAL IMPRESSION(S) / ED DIAGNOSES  Final diagnoses:  Acute upper GI bleed  Hematemesis, presence of nausea not specified  Atrial flutter with rapid ventricular response (HCC)      NEW MEDICATIONS STARTED DURING THIS VISIT:  New Prescriptions   No medications on file     Note:  This document was prepared using Dragon voice recognition software and  may include unintentional dictation errors.    Gayla Doss, MD 05/23/2016 1504

## 2016-05-15 NOTE — Progress Notes (Signed)
eLink Physician-Brief Progress Note Patient Name: Amanda Mcdowell DOB: [birth year] MRN: [id number]   Date of Service  05/23/2016  HPI/Events of Note  Elderly female admitted with GI bleed.  Elevated lactic acid now but BP normal and patient not is distress.  May actually be on its way down from prior episode of hypotension.  eICU Interventions  Replete lactic acid in 2 hrs     Intervention Category Major Interventions: Acid-Base disturbance - evaluation and management  Henry Russel, P 05/25/2016, 6:47 PM

## 2016-05-15 NOTE — H&P (Addendum)
[facility] - [place] at [facility]   PATIENT NAME: Amanda Mcdowell    MR#:  [id number]  DATE OF BIRTH:  01/14/1926  DATE OF ADMISSION:  Jun 09, 2016  PRIMARY CARE PHYSICIAN: No primary care provider on file.   REQUESTING/REFERRING PHYSICIAN: Gayla Doss, MD  CHIEF COMPLAINT:   Chief Complaint  Patient presents with  . Hematemesis   HematemesisToday. HISTORY OF PRESENT ILLNESS:  Amanda Mcdowell  is a 82 y.o. female with a known history of Hypertension, a flutter, COPD, UTI and heart failure. The patient was sent from nursing home to the ED due to hematemesis today. The patient is demented, only complains of abdominal pain and unable to provide any information. According to the patient's daughter, patient was found to have vomiting blood in nursing home today and sent to ED for further evaluation. According to Dr. Inocencio Homes, ED physician, the patient was found to have A flutter with heart rate at 150s. She was treated with Cardizem 10 mg IV 1 dose, but blood pressure decreased to 70s. She is being treated with IV fluid. The patient is bedbound per her daughter.  PAST MEDICAL HISTORY:   Past Medical History  Diagnosis Date  . HTN (hypertension)   . AV block, 1st degree   . Atrial flutter (HCC)   . COPD (chronic obstructive pulmonary disease) (HCC)   . RA (rheumatoid arthritis) (HCC)   . Anemia   . Hyperlipidemia   . Heart failure (HCC)   . UTI (lower urinary tract infection)   . SSS (sick sinus syndrome) (HCC)   . Dementia   . Depression   . Appendicitis     PAST SURGICAL HISTORY:   Past Surgical History  Procedure Laterality Date  . Peg placement    . Bowel obstruction surgery      SOCIAL HISTORY:   Social History  Substance Use Topics  . Smoking status: Never Smoker   . Smokeless tobacco: Not on file  . Alcohol Use: No    FAMILY HISTORY:   Family History  Problem Relation Age of Onset  . Family history unknown: Yes    DRUG ALLERGIES:    Allergies  Allergen Reactions  . Atenolol Other (See Comments)    Unknown reaction.    REVIEW OF SYSTEMS:  Unable to get ROS due to dementia.  MEDICATIONS AT HOME:   Prior to Admission medications   Medication Sig Start Date End Date Taking? Authorizing Provider  acetaminophen (TYLENOL) 325 MG tablet 650 mg by Gastric Tube route every 6 (six) hours as needed for mild pain or moderate pain. *Note Dose*   Yes Historical Provider, MD  acetaminophen (TYLENOL) 650 MG suppository Place 650 mg rectally every 4 (four) hours as needed for fever. *Take for a temperature 100 or greater*   Yes Historical Provider, MD  albuterol (PROVENTIL HFA;VENTOLIN HFA) 108 (90 Base) MCG/ACT inhaler Inhale 2 puffs into the lungs every 6 (six) hours as needed for wheezing. *Note dose*   Yes Historical Provider, MD  bisacodyl (DULCOLAX) 10 MG suppository Place 10 mg rectally daily as needed for moderate constipation. PER MAR give if resident starts vomiting.   Yes Historical Provider, MD  bisacodyl (DULCOLAX) 10 MG suppository Place 10 mg rectally every Monday, Wednesday, and Friday. *Hold for loose stools*   Yes Historical Provider, MD  cholecalciferol (VITAMIN D) 1000 units tablet 1,000 Units by Gastric Tube route daily.   Yes Historical Provider, MD  clotrimazole-betamethasone (LOTRISONE) cream Apply 1 application topically 3 (  three) times daily. Apply to rash on back til resolved.   Yes Historical Provider, MD  cyanocobalamin (,VITAMIN B-12,) 1000 MCG/ML injection Inject 1,000 mcg into the muscle every 30 (thirty) days.   Yes Historical Provider, MD  escitalopram (LEXAPRO) 10 MG tablet 10 mg by Gastric Tube route daily.   Yes Historical Provider, MD  linaclotide (LINZESS) 290 MCG CAPS capsule 290 mcg by Gastric Tube route daily. *Mix with 30 ml of water*   Yes Historical Provider, MD  lisinopril (PRINIVIL,ZESTRIL) 2.5 MG tablet 2.5 mg by Gastric Tube route daily.   Yes Historical Provider, MD  magnesium oxide  (MAG-OX) 400 MG tablet 400 mg by Gastric Tube route daily.   Yes Historical Provider, MD  metoprolol (LOPRESSOR) 50 MG tablet 50 mg by Gastric Tube route every 6 (six) hours. *hold for HR < 56*   Yes Historical Provider, MD  omeprazole (PRILOSEC OTC) 20 MG tablet 20 mg by Gastric Tube route daily.   Yes Historical Provider, MD  predniSONE (DELTASONE) 5 MG tablet 5 mg by Gastric Tube route daily.   Yes Historical Provider, MD  promethazine (PHENERGAN) 25 MG/ML injection Inject 25 mg into the muscle once. Give If resident starts vomiting; hold TF x 4 hrs, and give dulcolax suppository.   Yes Historical Provider, MD  Skin Protectants, Misc. (EUCERIN) cream Apply 1 application topically 2 (two) times daily. *Apply to lower extremities and feet*   Yes Historical Provider, MD  traMADol (ULTRAM) 50 MG tablet 25 mg by Gastric Tube route every 4 (four) hours. Take for breakthrough pain. *Note Dose*   Yes Historical Provider, MD  Wheat Dextrin (BENEFIBER) POWD 5 mLs by Gastric Tube route daily. *Mix with 4-8 oz of water.*   Yes Historical Provider, MD      VITAL SIGNS:  Blood pressure 79/66, pulse 135, temperature 97.5 F (36.4 C), temperature source Axillary, resp. rate 23, height 5\' 6"  (1.676 m), weight 180 lb (81.647 kg), SpO2 99 %.  PHYSICAL EXAMINATION:  GENERAL:  82 y.o.-year-old patient lying in the bed with Lethargy. EYES: Pupils equal, round, reactive to light and accommodation. No scleral icterus. Extraocular muscles intact.  HEENT: Head atraumatic, normocephalic. Oropharynx and nasopharynx clear.  NECK:  Supple, no jugular venous distention. No thyroid enlargement, no tenderness.  LUNGS: Normal breath sounds bilaterally, no wheezing, rales,rhonchi or crepitation. No use of accessory muscles of respiration.  CARDIOVASCULAR: S1, S2 normal. No murmurs, rubs, or gallops.  ABDOMEN: Soft, tenderness in epigastric area, nondistended. Bowel sounds present. No organomegaly or mass.  EXTREMITIES: No  pedal edema, cyanosis, or clubbing.  NEUROLOGIC: Unable to exam. PSYCHIATRIC: The patient is demented. SKIN: No obvious rash, lesion, or ulcer.   LABORATORY PANEL:   CBC  Recent Labs Lab 2016/06/10 1306  WBC 29.6*  HGB 10.3*  HCT 32.4*  PLT 251   ------------------------------------------------------------------------------------------------------------------  Chemistries   Recent Labs Lab 06-10-2016 1306  NA 135  K 4.2  CL 96*  CO2 23  GLUCOSE 171*  BUN 53*  CREATININE 1.43*  CALCIUM 9.0  AST 30  ALT 17  ALKPHOS 74  BILITOT 0.8   ------------------------------------------------------------------------------------------------------------------  Cardiac Enzymes  Recent Labs Lab 2016/06/10 1306  TROPONINI 0.11*   ------------------------------------------------------------------------------------------------------------------  RADIOLOGY:  Dg Abd 1 View  06-10-16  CLINICAL DATA:  Pain, vomiting. EXAM: ABDOMEN - 1 VIEW COMPARISON:  None FINDINGS: Nonspecific bowel gas pattern. Mild dilatation of bowel in the right pelvis which is likely the cecum filled with stool. No convincing evidence for bowel obstruction.  No organomegaly or free air. Gastrostomy tube projects over the stomach. Visualized lung bases are clear. IMPRESSION: Nonspecific bowel gas pattern without evidence for obstruction. Large stool burden in the right colon. Electronically Signed   By: Charlett Nose M.D.   On: 06/09/2016 13:53   Dg Chest Portable 1 View  05/31/2016  CLINICAL DATA:  Pain, vomiting. EXAM: PORTABLE CHEST 1 VIEW COMPARISON:  None. FINDINGS: Mild cardiomegaly. Minimal left base atelectasis. Right lung is clear. No effusions. Heart is normal size. IMPRESSION: Left base atelectasis.  Mild cardiomegaly. Electronically Signed   By: Charlett Nose M.D.   On: 05/17/2016 13:53    EKG:   Orders placed or performed during the hospital encounter of 06/07/2016  . EKG 12-Lead  . EKG 12-Lead     IMPRESSION AND PLAN:   The patient will be admitted to stepdown unit.  GI bleeding and anemia due to acute blood loss The patient will be admitted to stepdown unit. Follow-up hemoglobin every 8 hours, continue IV Protonix and GI consult. Keep nothing by mouth with IV fluid support.  A flutter with RVR, possible due to GI bleeding and sepsis. Give IV fluid to support, no Cardizem or Lopressor due to hypotension. No aspirin or anticoagulation due to GI bleeding. Cardiology consult. Digoxin IV when necessary.  Hypotension. Hold Lopressor and lisinopril. Give IV fluid bolus and support. Levophed drip when necessary.  Sepsis.  Follow-up CBC, urinalysis, urine culture and blood culture. Start Zosyn and vancomycin pharmacy to dose. Follow-up lactic acid.  Acute renal failure. Continue IV fluid support with normal saline and follow-up BMP. Hold the lisinopril.  Elevated troponin. Possible due to above. Follow-up troponin level and cardiology consult.  The patient is very critical and has poor prognosis. I will request palliative consult.  All the records are reviewed and case discussed with ED provider. Management plans discussed with the patient's daughter [facility]) and she is in agreement.  CODE STATUS: DO NOT RESUSCITATE  TOTAL CRITICAL TIME TAKING CARE OF THIS PATIENT: 65 minutes.    Shaune Pollack M.D on 05/14/2016 at 2:59 PM  Between 7am to 6pm - Pager - [phone]  After 6pm go to [email or web address] - password [email or web address]  [place] [facility]  Office  [phone]  CC: Primary care physician; No primary care provider on file.

## 2016-05-15 NOTE — Consult Note (Signed)
GI Inpatient Consult Note  Reason for Consult: Hematemesis, upper GI bleed   Attending Requesting Consult: Dr. Imogene Burn  History of Present Illness: Amanda Mcdowell is a 82 y.o. female with a known history of demenita, DM II (no longer on medication), HTN, HLD, and h/o multiple bowel obstructions presenting to the [facility] ED with hematemesis.  Patient's daughter provides 95% of the history due to patient's severe dementia.  She states patient has experienced nausea w/ NBNB vomiting for the last 6 months or so.  Etiology of this is unclear.  This morning, patient's daughter was called by [facility] reporting blood in patient's emesis.  Per daughter, no h/o of hematemesis previously.  She is unsure if she vomited several times prior to hematemesis.  In the ED, patient vomited several times with "only blood in there" per her daughter.  Patient shakes her head "yes" when asked if she has abdominal pain; she points to the hypogastric area.  Patient's daughter denies a known history of GERD, gastritis, or PUD.  A PEG tube is in place, and all feedings are given via this route due to lack of appetite and dementia.  BMs are chronically loose, w/o hematochezia or melena.  Patient's weight is stable.  She is not on anticoagulation.  Patient's daughter recalls ASA (? 81 or 325mg ) was d/c about 6 months ago when nausea/vomiting began.  She takes only Tylenol prn for pain - no NSAID use.  No prior EGDs or colonoscopies.  Cardiac and pulmonary history, as well as current medications, largely unknown by daughter.  Afbrile BPs 143/63 -- 79/66 HR between 153 -- 88, resp 27 --17 O2 sats 75 -- 99 EKG: Aflutter per Dr. Labs: Hgb 10.3, Hct 32.4; troponin 0.11 KUB: large stool burden in R colon, negative for obstruction CXR: mild cardiomegaly, L base atelectasis  Past Medical History:  No past medical history on file.  Problem List: Patient Active Problem List   Diagnosis Date Noted  . GIB (gastrointestinal  bleeding) 05/13/2016  . Anemia 05/24/2016  . Atrial flutter (HCC) 05/27/2016  . Sepsis (HCC) 05/21/2016    Past Surgical History: No past surgical history on file.  Allergies: Allergies  Allergen Reactions  . Atenolol Other (See Comments)    Unknown reaction.    Home Medications:  (Not in a hospital admission) Home medication reconciliation was completed with the patient.   Scheduled Inpatient Medications:     Continuous Inpatient Infusions:   . pantoprozole (PROTONIX) infusion 8 mg/hr (05/14/2016 1407)  . sodium chloride 1,000 mL (05/23/2016 1445)    PRN Inpatient Medications:    Family History: family history is not on file.  The patient's family history is negative for inflammatory bowel disorders, GI malignancy, or solid organ transplantation.  Social History:    The patient denies ETOH, tobacco, or drug use.   Review of Systems: (limited due to patient's dementia - obtain from patient and daughter) Constitutional: Weight is stable.  Eyes: No changes in vision. ENT: No oral lesions, sore throat.  GI: see HPI.  Heme/Lymph: No easy bruising.  CV: No chest pain.  GU: No hematuria.  Integumentary: No rashes.  Neuro: No headaches.  Psych: No depression/anxiety.  Endocrine: No heat/cold intolerance.  Allergic/Immunologic: No urticaria.  Resp: No cough, SOB.  Musculoskeletal: No joint swelling.    Physical Examination: BP 79/66 mmHg  Pulse 135  Temp(Src) 97.5 F (36.4 C) (Axillary)  Resp 23  Ht 5\' 6"  (1.676 m)  Wt 81.647 kg (180 lb)  BMI 29.07 kg/m2  SpO2 99% Gen: NAD, alert, unable to provide insight into symptoms due to dementia;daughter at bedside HEENT: PEERLA, EOMI, Neck: supple, no JVD or thyromegaly Chest: CTA bilaterally, no wheezes, crackles, or other adventitious sounds CV: RRR, no m/g/c/r Abd: soft, mild TTP moderate palpation of LLQ & RLQ > LUQ & RUQ, ND, +BS in all four quadrants; no HSM, guarding, ridigity, or rebound tenderness; PEG tube in  place in LUQ w/o redness, warmth, erythema, or drainage Ext: no edema, well perfused with 2+ pulses Skin: no rash or lesions noted Lymph: no LAD  Data: Lab Results  Component Value Date   WBC 29.6* 14-Jun-2016   HGB 10.3* Jun 14, 2016   HCT 32.4* 06-14-16   MCV 87.0 06-14-2016   PLT 251 2016-06-14    Recent Labs Lab 06-14-2016 1306  HGB 10.3*   Lab Results  Component Value Date   NA 135 06/14/16   K 4.2 2016/06/14   CL 96* 06/14/16   CO2 23 06/14/16   BUN 53* 2016/06/14   CREATININE 1.43* 2016/06/14   Lab Results  Component Value Date   ALT 17 06/14/16   AST 30 06/14/2016   ALKPHOS 74 Jun 14, 2016   BILITOT 0.8 2016-06-14    Recent Labs Lab 2016/06/14 1306  APTT 25  INR 1.16   Assessment/Plan: Amanda Mcdowell is a 82 y.o. female with a known history of demenita, DM II (no longer on medication), HTN, HLD, and h/o multiple bowel obstructions presenting to the [facility] ED with hematemesis.  History is difficult to obtain due to patient's dementia.  Daughter is able to provide some insight into patient's recent health, but patient is largely managed by her caregivers at [facility].  Daughter relates a h/o nausea and vomiting several times per week for the last 6 months.  Emesis was not bloody until this morning.  Hgb 10.3, decreased approximately 3 points from previous baseline.  Patient is tachycardic with variable BP and O2 sats.  Likely Mallory-Weiss tear given persistence of vomiting over the last several months.  Recommend starting IV PPI therapy q 12 hrs and IV antiemetics q 6 hrs.  Recommend monitoring serial Hgb. Given patient's vitals, overall poor functional status, and DNR, prefer watchful waiting over immediate intervention if symptoms resolve with conservative therapy.  Further recs pending above.  Also see Dr. Earnest Conroy note.  Recommendations: - Start Protonix 40mg  q 12 hrs or Pepcid 40mg  q 12 hrs if Protonix remains on national shortage - IV Zofran 4mg  q 6 hrs  continuously - Monitor serial Hgb - No EGD planned at this time - will continue to monitor   Thank you for the consult. We will follow along with you. Please call with questions or concerns.  , PA-C [facility] Phone: [phone] Pager: [phone]

## 2016-05-15 NOTE — Consult Note (Signed)
See note from PA [facility].  Patient with a significant amount of vomiting over the last 1-2 weeks.  She started vomiting BRB this morning and was brought here and found to have tachycardia up to 150 BPM and when this was treated with a small dose of Cardiazem her BP went very low.  She has had a feeding tube in place for  1 1/2 years.  This is the first time she has had hematemesis since the tube was placed.  All history came from her daughter.  The patient is a resident of [facility].  Exam shows good air flow on exam and rapid heart rate and abdomen with prominent scars from previous surgery, bowel sounds present, there is tenderness in LLQ over a large scar.  HR 140-150 on monitor.    A: UGI bleed could well be due to vomiting several times recently and produced inflammation which caused bleeding.  Given her fragile status (rapid drop in BP with small dose of Cardiazem)   I think it best to watch her for now, monitor Hgb, give around the clock Zofran and see if can allow mucosa to heal and stop bleeding.

## 2016-05-15 NOTE — ED Notes (Signed)
Xray to bedside.

## 2016-05-15 NOTE — ED Notes (Signed)
Portable xray performed in ED8

## 2016-05-15 NOTE — Consult Note (Addendum)
PULMONARY / CRITICAL CARE MEDICINE   Name: Amanda Mcdowell MRN: [id number] DOB: 03/12/1926    ADMISSION DATE:  2016/06/06   CONSULTATION DATE:  Jun 06, 2016  REFERRING MD:  Hospitalist  CHIEF COMPLAINT: Hypotension, GIB and sepsis  HISTORY OF PRESENT ILLNESS:   This is an 82 yo AA female, SNF resident with a PMH of hypertension, COPD, Atrial flutter, dementia, anemia, CHF, depression and frequent UTIs who presented with hematemesis and bloody stools. History is obtained from ED records as patient has dementia and unable to relay the events that led to this hospitalization. Apparently patient was found vomiting blood at the nursing home where she lives. At the ED, patient's heart rate was in the 150s and her rhythm was A. fib flutter. She does have baseline history of a flutter she was started on a Cardizem infusion, but her blood pressure dropped so difficult infusion was stopped. She was given 2.5 L of fluids, but she remained tachycardic and mildly hypotensive. PCCM was consulted for further management. She continues to have tarry stools, but no hematemesis.  PAST MEDICAL HISTORY :  She  has a past medical history of HTN (hypertension); AV block, 1st degree; Atrial flutter (HCC); COPD (chronic obstructive pulmonary disease) (HCC); RA (rheumatoid arthritis) (HCC); Anemia; Hyperlipidemia; Heart failure (HCC); UTI (lower urinary tract infection); SSS (sick sinus syndrome) (HCC); Dementia; Depression; and Appendicitis.  PAST SURGICAL HISTORY: She  has past surgical history that includes PEG placement and bowel obstruction surgery.  Allergies  Allergen Reactions  . Atenolol Other (See Comments)    Unknown reaction.    No current facility-administered medications on file prior to encounter.   No current outpatient prescriptions on file prior to encounter.    FAMILY HISTORY:  Her has no family status information on file.   SOCIAL HISTORY: She  reports that she has never smoked. She does not  have any smokeless tobacco history on file. She reports that she does not drink alcohol or use illicit drugs.  REVIEW OF SYSTEMS:   Unable to obtain a full review of systems. Patient is only complaining of abdominal pain which she cannot fully characterize  SUBJECTIVE:    VITAL SIGNS: BP 116/78 mmHg  Pulse 72  Temp(Src) 97.6 F (36.4 C) (Axillary)  Resp 22  Ht 5\' 6"  (1.676 m)  Wt 166 lb 7.2 oz (75.5 kg)  BMI 26.88 kg/m2  SpO2 100%  HEMODYNAMICS:    VENTILATOR SETTINGS:    INTAKE / OUTPUT:    PHYSICAL EXAMINATION: General: Chronically ill-looking Neuro: Alert to person only, speech is slow, moves all extremities, age related changes in muscle strength HEENT: PERRLA, conjunctivae pale, trachea midline Cardiovascular: Tachycardic with heart rates in the 120s, S1, S2 audible. No murmur, regurg or gallop Lungs:  Bilateral airflow with diminished breath sounds bilaterally. No rhonchi or wheezes Abdomen:  Nondistended, positive bowel sounds, pain with gentle palpation, worse in left upper and lower quadrants Musculoskeletal: Positive range of motion, mild contractures. Extremities: +2 pulses, trace edema Skin: Warm and dry  LABS:  BMET  Recent Labs Lab June 06, 2016 1306  NA 135  K 4.2  CL 96*  CO2 23  BUN 53*  CREATININE 1.43*  GLUCOSE 171*    Electrolytes  Recent Labs Lab 06/06/2016 1306 06-06-2016 1752  CALCIUM 9.0  --   MG  --  2.4    CBC  Recent Labs Lab 06-06-2016 1306 06-Jun-2016 1752  WBC 29.6*  --   HGB 10.3* 9.7*  HCT 32.4*  --  PLT 251  --     Coag's  Recent Labs Lab 05/13/2016 1306  APTT 25  INR 1.16    Sepsis Markers  Recent Labs Lab 05/29/2016 1752 05/21/2016 2100  LATICACIDVEN 5.9* 5.1*  PROCALCITON 2.71  --     ABG No results for input(s): PHART, PCO2ART, PO2ART in the last 168 hours.  Liver Enzymes  Recent Labs Lab 06/04/2016 1306  AST 30  ALT 17  ALKPHOS 74  BILITOT 0.8  ALBUMIN 2.9*    Cardiac Enzymes  Recent  Labs Lab 06/05/2016 1306 05/24/2016 1752  TROPONINI 0.11* 0.29*    Glucose  Recent Labs Lab 05/18/2016 1715  GLUCAP 109*    Imaging Dg Abd 1 View  05/16/2016  CLINICAL DATA:  Pain, vomiting. EXAM: ABDOMEN - 1 VIEW COMPARISON:  None FINDINGS: Nonspecific bowel gas pattern. Mild dilatation of bowel in the right pelvis which is likely the cecum filled with stool. No convincing evidence for bowel obstruction. No organomegaly or free air. Gastrostomy tube projects over the stomach. Visualized lung bases are clear. IMPRESSION: Nonspecific bowel gas pattern without evidence for obstruction. Large stool burden in the right colon. Electronically Signed   By: Charlett Nose M.D.   On: 05/21/2016 13:53   Dg Chest Portable 1 View  05/17/2016  CLINICAL DATA:  Pain, vomiting. EXAM: PORTABLE CHEST 1 VIEW COMPARISON:  None. FINDINGS: Mild cardiomegaly. Minimal left base atelectasis. Right lung is clear. No effusions. Heart is normal size. IMPRESSION: Left base atelectasis.  Mild cardiomegaly. Electronically Signed   By: Charlett Nose M.D.   On: 05/14/2016 13:53    STUDIES:  None  CULTURES: Blood cultures 2. Urine cultures MRSA screen negative  ANTIBIOTICS: Vancomycin 05/13/2016 Zosyn 05/28/2016  SIGNIFICANT EVENTS: 05/24/2016: Admitted with acute GI bleed, hypovolemic shock, a flutter  LINES/TUBES: Peripheral IVs  DISCUSSION: 82 year old African-American female presenting with acute GI bleed, hypovolemic versus septic shock shock, sepsis, possibly of GI source, a flutter with rates in the 150s and elevated troponins  ASSESSMENT / PLAN:  PULMONARY A: History of COPD-patient is only on albuterol at home P:   -Supplemental oxygen as needed. -Nebulized bronchodilator  CARDIOVASCULAR A:  Shock-septic versus hypovolemic. History of hypertension A flutter with elevated heart rates in the 150s-refractory to IV fluids, and beta blockers. History of sick sinus syndrome. History of congestive  heart failure-no recent echo. History of first-degree AV block Elevated troponin-0.1 to 0.28 P:  -Daily EKG -Diltiazem infusion. -IV fluids -Hemodynamics per ICU protocol -2-D echo to evaluate left ventricular function   RENAL A:   AKI P:   -IV fluids -Trend creatinine -Renally dose medications. -Monitor and replace electrolytes  GASTROINTESTINAL A:   Acute GI bleed Abdominal pain-generalized but more in the left lower and upper quadrants Dysphagia s/p gastrostomy tube P:   -Awaiting GI input. -Protonix infusion -Nothing by mouth  HEMATOLOGIC A:   Acute blood loss anemia-hemoglobin dropped from 10.3-8.5 P:  -Transfuse 1 unit of packed red blood cells -Trend hemoglobin and hematocrit  INFECTIOUS A:   Sepsis of unknown source, most likely GI P:   -Broad-spectrum antibiotics. -Follow-up cultures  ENDOCRINE A:   No acute issues  P:   -Monitor blood glucose with BMPs  NEUROLOGIC A:   History of dementia. History of depression P:   RASS goal: Not applicable -Supportive care   Disposition and family update: No family at bedside. Further changes in current treatment plan pending clinical course and diagnostics.  Best Practice: Code Status: DNR Diet:  NPO GI prophylaxis:  PPI. VTE prophylaxis:  SCD's / contraindicated due to GI bleed  Magdalene S. [facility] ANP-BC Pulmonary and Critical Care Medicine [facility] Pager [phone] or [phone]  05/14/2016, 10:43 PM  Note signed by me in Jenna Luo, MD

## 2016-05-16 ENCOUNTER — Inpatient Hospital Stay: Payer: Medicare Other

## 2016-05-16 ENCOUNTER — Inpatient Hospital Stay
Admit: 2016-05-16 | Discharge: 2016-05-16 | Disposition: A | Discharge status: Home / Self Care | Payer: Medicare Other | Attending: Adult Health | Admitting: Adult Health

## 2016-05-16 DIAGNOSIS — K922 Gastrointestinal hemorrhage, unspecified: Secondary | ICD-10-CM

## 2016-05-16 DIAGNOSIS — L8990 Pressure ulcer of unspecified site, unspecified stage: Secondary | ICD-10-CM | POA: Insufficient documentation

## 2016-05-16 DIAGNOSIS — I4892 Unspecified atrial flutter: Secondary | ICD-10-CM

## 2016-05-16 LAB — ECHOCARDIOGRAM COMPLETE
Height: 66 in
WEIGHTICAEL: 2663.16 [oz_av]

## 2016-05-16 LAB — MAGNESIUM: MAGNESIUM: 2.1 mg/dL (ref 1.7–2.4)

## 2016-05-16 LAB — URINALYSIS COMPLETE WITH MICROSCOPIC (ARMC ONLY)
BILIRUBIN URINE: NEGATIVE
GLUCOSE, UA: NEGATIVE mg/dL
Ketones, ur: NEGATIVE mg/dL
LEUKOCYTES UA: NEGATIVE
NITRITE: NEGATIVE
PH: 5 (ref 5.0–8.0)
Protein, ur: 30 mg/dL — AB
SPECIFIC GRAVITY, URINE: 1.016 (ref 1.005–1.030)

## 2016-05-16 LAB — CBC WITH DIFFERENTIAL/PLATELET
BASOS ABS: 0 10*3/uL (ref 0–0.1)
Basophils Relative: 0 %
Eosinophils Absolute: 0 10*3/uL (ref 0–0.7)
HEMATOCRIT: 25.3 % — AB (ref 35.0–47.0)
Hemoglobin: 8.2 g/dL — ABNORMAL LOW (ref 12.0–16.0)
Lymphs Abs: 0.7 10*3/uL — ABNORMAL LOW (ref 1.0–3.6)
MCH: 28.6 pg (ref 26.0–34.0)
MCHC: 32.5 g/dL (ref 32.0–36.0)
MCV: 88 fL (ref 80.0–100.0)
Monocytes Absolute: 0.7 10*3/uL (ref 0.2–0.9)
Monocytes Relative: 3 %
NEUTROS ABS: 25.8 10*3/uL — AB (ref 1.4–6.5)
PLATELETS: 172 10*3/uL (ref 150–440)
RBC: 2.87 MIL/uL — AB (ref 3.80–5.20)
RDW: 17.7 % — ABNORMAL HIGH (ref 11.5–14.5)
WBC: 27.2 10*3/uL — AB (ref 3.6–11.0)

## 2016-05-16 LAB — URINE CULTURE: CULTURE: NO GROWTH

## 2016-05-16 LAB — HEPATIC FUNCTION PANEL
ALBUMIN: 2.1 g/dL — AB (ref 3.5–5.0)
ALT: 16 U/L (ref 14–54)
AST: 32 U/L (ref 15–41)
Alkaline Phosphatase: 54 U/L (ref 38–126)
Bilirubin, Direct: 0.3 mg/dL (ref 0.1–0.5)
Indirect Bilirubin: 0.9 mg/dL (ref 0.3–0.9)
Total Bilirubin: 1.2 mg/dL (ref 0.3–1.2)
Total Protein: 4.8 g/dL — ABNORMAL LOW (ref 6.5–8.1)

## 2016-05-16 LAB — BASIC METABOLIC PANEL
Anion gap: 7 (ref 5–15)
BUN: 48 mg/dL — AB (ref 6–20)
CHLORIDE: 111 mmol/L (ref 101–111)
CO2: 20 mmol/L — AB (ref 22–32)
CREATININE: 1.15 mg/dL — AB (ref 0.44–1.00)
Calcium: 7.1 mg/dL — ABNORMAL LOW (ref 8.9–10.3)
GFR calc non Af Amer: 41 mL/min — ABNORMAL LOW (ref >60)
GFR, EST AFRICAN AMERICAN: 48 mL/min — AB (ref >60)
Glucose, Bld: 129 mg/dL — ABNORMAL HIGH (ref 65–99)
POTASSIUM: 4 mmol/L (ref 3.5–5.1)
Sodium: 138 mmol/L (ref 135–145)

## 2016-05-16 LAB — CBC
HEMATOCRIT: 28.5 % — AB (ref 35.0–47.0)
HEMOGLOBIN: 9.2 g/dL — AB (ref 12.0–16.0)
MCH: 28.3 pg (ref 26.0–34.0)
MCHC: 32.2 g/dL (ref 32.0–36.0)
MCV: 87.9 fL (ref 80.0–100.0)
Platelets: 160 10*3/uL (ref 150–440)
RBC: 3.24 MIL/uL — AB (ref 3.80–5.20)
RDW: 17.6 % — ABNORMAL HIGH (ref 11.5–14.5)
WBC: 24.2 10*3/uL — ABNORMAL HIGH (ref 3.6–11.0)

## 2016-05-16 LAB — TROPONIN I
TROPONIN I: 0.19 ng/mL — AB (ref <0.031)
Troponin I: 0.28 ng/mL — ABNORMAL HIGH (ref <0.031)

## 2016-05-16 LAB — LACTIC ACID, PLASMA
Lactic Acid, Venous: 5.1 mmol/L (ref 0.5–2.0)
Lactic Acid, Venous: 3.7 mmol/L (ref 0.5–2.0)

## 2016-05-16 LAB — GLUCOSE, CAPILLARY: Glucose-Capillary: 120 mg/dL — ABNORMAL HIGH (ref 65–99)

## 2016-05-16 LAB — PROTIME-INR
INR: 1.23
Prothrombin Time: 15.7 seconds — ABNORMAL HIGH (ref 11.4–15.0)

## 2016-05-16 LAB — ABO/RH: ABO/RH(D): O POS

## 2016-05-16 LAB — AMYLASE: Amylase: 37 U/L (ref 28–100)

## 2016-05-16 LAB — PHOSPHORUS: PHOSPHORUS: 3.2 mg/dL (ref 2.5–4.6)

## 2016-05-16 LAB — PREPARE RBC (CROSSMATCH)

## 2016-05-16 LAB — LIPASE, BLOOD: Lipase: 12 U/L (ref 11–51)

## 2016-05-16 MED ORDER — DIGOXIN 0.25 MG/ML IJ SOLN
0.2500 mg | Freq: Once | INTRAMUSCULAR | Status: AC
  Administered 2016-05-16: 0.25 mg via INTRAVENOUS
  Filled 2016-05-16: qty 2

## 2016-05-16 MED ORDER — VANCOMYCIN HCL IN DEXTROSE 1-5 GM/200ML-% IV SOLN: 1000.0000 mg | INTRAVENOUS | Status: DC

## 2016-05-16 MED ORDER — MORPHINE SULFATE (PF) 2 MG/ML IV SOLN
1.0000 mg | INTRAVENOUS | Status: DC | PRN
  Administered 2016-05-16: 1 mg via INTRAVENOUS
  Administered 2016-05-19: 2 mg via INTRAVENOUS
  Filled 2016-05-16 (×2): qty 1

## 2016-05-16 MED ORDER — DILTIAZEM HCL 100 MG IV SOLR
5.0000 mg/h | INTRAVENOUS | Status: DC
  Administered 2016-05-16: 15 mg/h via INTRAVENOUS
  Administered 2016-05-16: 5 mg/h via INTRAVENOUS
  Administered 2016-05-16 – 2016-05-17 (×2): 10 mg/h via INTRAVENOUS
  Administered 2016-05-18: 5 mg/h via INTRAVENOUS
  Administered 2016-05-18: 10 mg/h via INTRAVENOUS
  Administered 2016-05-18 – 2016-05-20 (×2): 5 mg/h via INTRAVENOUS
  Filled 2016-05-16 (×9): qty 100

## 2016-05-16 MED ORDER — SODIUM CHLORIDE 0.9 % IV SOLN: Freq: Once | INTRAVENOUS | Status: DC

## 2016-05-16 MED ORDER — VANCOMYCIN HCL 10 G IV SOLR
1250.0000 mg | INTRAVENOUS | Status: DC
  Administered 2016-05-16: 1250 mg via INTRAVENOUS
  Filled 2016-05-16: qty 1250

## 2016-05-16 MED ORDER — SODIUM CHLORIDE 0.9 % IV BOLUS (SEPSIS)
1000.0000 mL | Freq: Once | INTRAVENOUS | Status: AC
  Administered 2016-05-16: 1000 mL via INTRAVENOUS

## 2016-05-16 NOTE — Progress Notes (Signed)
One unit pRBC infusing per MD order.

## 2016-05-16 NOTE — Progress Notes (Signed)
Patient HR sustaining 130's, approaching 140's.  Notified Magadalene, NP about HR and repeat lactic acid of 5.1.  New orders to bolus 1L NS and give metoprolol via tube.  Will continue to monitor.

## 2016-05-16 NOTE — Consult Note (Signed)
I accidentally signed note from NP Tukov and I do not know how to reverse this action.

## 2016-05-16 NOTE — Progress Notes (Signed)
[facility] - [place] at [facility]                                                                                                                                                                                            Patient Demographics   Amanda Mcdowell, is a 82 y.o. female, DOB - [birth year], FTD:[id number]  Admit date - 05/17/2016   Admitting Physician Shaune Pollack, MD  Outpatient Primary MD for the patient is No primary care provider on file.   LOS - 1  Subjective:Overnight patient had 3 bloody bowel movements Heart rate is continues to be elevated Patient is confused     Review of Systems:   CONSTITUTIONAL: Unable to provide due to confusion  Vitals:   Filed Vitals:   05/16/16 0600 05/16/16 0700 05/16/16 0800 05/16/16 0915  BP: 121/71 91/58 102/62   Pulse: 72     Temp:   98.6 F (37 C) 98.5 F (36.9 C)  TempSrc:   Axillary Axillary  Resp: 25 21 21 21   Height:      Weight:      SpO2: 99%   100%    Wt Readings from Last 3 Encounters:  06/05/2016 75.5 kg (166 lb 7.2 oz)     Intake/Output Summary (Last 24 hours) at 05/16/16 0951 Last data filed at 05/16/16 0915  Gross per 24 hour  Intake 5209.21 ml  Output      0 ml  Net 5209.21 ml    Physical Exam:   GENERAL: Critically ill-appearing HEAD, EYES, EARS, NOSE AND THROAT: Atraumatic, normocephalic. Pale conjunctiva. Pupils equal and reactive to light. Sclerae anicteric.  No oro-pharyngeal erythema.  NECK: Supple. There is no jugular venous distention. No bruits, no lymphadenopathy, no thyromegaly.  HEART: Irregularly irregular. No murmurs, no rubs, no clicks.  LUNGS: Clear to auscultation bilaterally. No rales or rhonchi. No wheezes.  ABDOMEN: Soft, flat, epigastric tenderness, nondistended. Has good bowel sounds. No hepatosplenomegaly appreciated.  EXTREMITIES: No evidence of any cyanosis, clubbing, or peripheral edema.  +2 pedal and radial pulses bilaterally.  NEUROLOGIC: The patient is  alert, not oriented to place or time with no focal motor or sensory deficits appreciated bilaterally.  SKIN: Moist and warm with no rashes appreciated.  Psych: Not anxious, depressed LN: No inguinal LN enlargement    Antibiotics   Anti-infectives    Start     Dose/Rate Route Frequency Ordered Stop   05/16/16 0300  vancomycin (VANCOCIN) 1,250 mg in sodium chloride 0.9 % 250 mL IVPB     1,250 mg 166.7 mL/hr over 90 Minutes Intravenous Every 36 hours 05/16/16 0048  05/31/2016 1800  vancomycin (VANCOCIN) IVPB 1000 mg/200 mL premix     1,000 mg 200 mL/hr over 60 Minutes Intravenous  Once 06/04/2016 1717 06/06/2016 2030   05/25/2016 1800  piperacillin-tazobactam (ZOSYN) IVPB 3.375 g     3.375 g 12.5 mL/hr over 240 Minutes Intravenous Every 8 hours 05/23/2016 1733     06/01/2016 1730  piperacillin-tazobactam (ZOSYN) IVPB 3.375 g  Status:  Discontinued     3.375 g 100 mL/hr over 30 Minutes Intravenous  Once 06/11/2016 1717 06/01/2016 1732   06/04/2016 1630  piperacillin-tazobactam (ZOSYN) IVPB 3.375 g  Status:  Discontinued     3.375 g 100 mL/hr over 30 Minutes Intravenous  Once 05/13/2016 1559 05/31/2016 1733   05/18/2016 1630  vancomycin (VANCOCIN) IVPB 1000 mg/200 mL premix  Status:  Discontinued     1,000 mg 200 mL/hr over 60 Minutes Intravenous  Once 05/26/2016 1602 06/08/2016 1734      Medications   Scheduled Meds: . sodium chloride   Intravenous Once  . digoxin  0.25 mg Intravenous Once  . ondansetron (ZOFRAN) IV  4 mg Intravenous Q6H  . piperacillin-tazobactam (ZOSYN)  IV  3.375 g Intravenous Q8H  . sodium chloride flush  3 mL Intravenous Q12H  . vancomycin  1,250 mg Intravenous Q36H   Continuous Infusions: . sodium chloride 75 mL/hr at 05/14/2016 2000  . diltiazem (CARDIZEM) infusion 10 mg/hr (05/16/16 0939)  . pantoprozole (PROTONIX) infusion 8 mg/hr (05/16/16 0939)   PRN Meds:.albuterol, morphine injection, ondansetron **OR** [DISCONTINUED] ondansetron (ZOFRAN) IV   Data Review:   Micro  Results Recent Results (from the past 240 hour(s))  MRSA PCR Screening     Status: None   Collection Time: 06/05/2016  5:51 PM  Result Value Ref Range Status   MRSA by PCR NEGATIVE NEGATIVE Final    Comment:        The GeneXpert MRSA Assay (FDA approved for NASAL specimens only), is one component of a comprehensive MRSA colonization surveillance program. It is not intended to diagnose MRSA infection nor to guide or monitor treatment for MRSA infections.     Radiology Reports Dg Abd 1 View  05/16/2016  CLINICAL DATA:  Abdominal pain EXAM: ABDOMEN - 1 VIEW COMPARISON:  05/30/2016 FINDINGS: Gastrostomy tube projects over the left abdomen. Nonobstructive bowel gas pattern. No free air organomegaly. No acute bony abnormality. Bilateral hip replacements noted. IMPRESSION: No acute findings. Electronically Signed   By: Charlett Nose M.D.   On: 05/16/2016 07:21   Dg Abd 1 View  05/16/2016  CLINICAL DATA:  Pain, vomiting. EXAM: ABDOMEN - 1 VIEW COMPARISON:  None FINDINGS: Nonspecific bowel gas pattern. Mild dilatation of bowel in the right pelvis which is likely the cecum filled with stool. No convincing evidence for bowel obstruction. No organomegaly or free air. Gastrostomy tube projects over the stomach. Visualized lung bases are clear. IMPRESSION: Nonspecific bowel gas pattern without evidence for obstruction. Large stool burden in the right colon. Electronically Signed   By: Charlett Nose M.D.   On: 05/25/2016 13:53   Dg Chest Portable 1 View  06/05/2016  CLINICAL DATA:  Pain, vomiting. EXAM: PORTABLE CHEST 1 VIEW COMPARISON:  None. FINDINGS: Mild cardiomegaly. Minimal left base atelectasis. Right lung is clear. No effusions. Heart is normal size. IMPRESSION: Left base atelectasis.  Mild cardiomegaly. Electronically Signed   By: Charlett Nose M.D.   On: 05/13/2016 13:53     CBC  Recent Labs Lab 06/06/2016 1306 05/28/2016 1752 05/14/2016 2325 05/16/16 0539  WBC  29.6*  --   --  27.2*  HGB 10.3*  9.7* 8.5* 8.2*  HCT 32.4*  --   --  25.3*  PLT 251  --   --  172  MCV 87.0  --   --  88.0  MCH 27.7  --   --  28.6  MCHC 31.8*  --   --  32.5  RDW 18.3*  --   --  17.7*  LYMPHSABS 0.8*  --   --  0.7*  MONOABS 1.4*  --   --  0.7  EOSABS 0.0  --   --  0.0  BASOSABS 0.1  --   --  0.0    Chemistries   Recent Labs Lab 06/11/2016 1306 05/23/2016 1752  NA 135  --   K 4.2  --   CL 96*  --   CO2 23  --   GLUCOSE 171*  --   BUN 53*  --   CREATININE 1.43*  --   CALCIUM 9.0  --   MG  --  2.4  AST 30  --   ALT 17  --   ALKPHOS 74  --   BILITOT 0.8  --    ------------------------------------------------------------------------------------------------------------------ estimated creatinine clearance is 28.2 mL/min (by C-G formula based on Cr of 1.43). ------------------------------------------------------------------------------------------------------------------ No results for input(s): HGBA1C in the last 72 hours. ------------------------------------------------------------------------------------------------------------------ No results for input(s): CHOL, HDL, LDLCALC, TRIG, CHOLHDL, LDLDIRECT in the last 72 hours. ------------------------------------------------------------------------------------------------------------------ No results for input(s): TSH, T4TOTAL, T3FREE, THYROIDAB in the last 72 hours.  Invalid input(s): FREET3 ------------------------------------------------------------------------------------------------------------------ No results for input(s): VITAMINB12, FOLATE, FERRITIN, TIBC, IRON, RETICCTPCT in the last 72 hours.  Coagulation profile  Recent Labs Lab 06/03/2016 1306 05/16/16 0539  INR 1.16 1.23    No results for input(s): DDIMER in the last 72 hours.  Cardiac Enzymes  Recent Labs Lab 05/30/2016 1306 06/07/2016 1752 06/08/2016 2325  TROPONINI 0.11* 0.29* 0.28*    ------------------------------------------------------------------------------------------------------------------ Invalid input(s): POCBNP    Assessment & Plan   Patient is a 82 year old African-American female presenting with upper GI bleed  1. UPPER GI bleeding and anemia due to acute blood loss Continue IV Protonix drip Follow H&H transfuse as needed, currently receiving blood Patient may need an endoscopy due to her persistent bleeding  2. A flutter with RVR,  Continue IV Cardizem drip heart rate still poor control I will give dose of IV digoxin Cardiology consult has been placed  3. Sepsis and hypotension WBC count continues to be elevated Urinalysis reviewed chest x-ray reviewed which were negative Continue empiric Zosyn and vancomycin  4. Acute renal failure continue supportive care  5. Elevated troponin. Felt to be due to demand ischemia continue to monitor cardiology evaluation pending  6. Miscellaneous SCDs for DVT prophylaxis  7. Code DO NOT RESUSCITATE     Code Status Orders        Start     Ordered   05/20/2016 1718  Do not attempt resuscitation (DNR)   Continuous    Question Answer Comment  In the event of cardiac or respiratory ARREST Do not call a "code blue"   In the event of cardiac or respiratory ARREST Do not perform Intubation, CPR, defibrillation or ACLS   In the event of cardiac or respiratory ARREST Use medication by any route, position, wound care, and other measures to relive pain and suffering. May use oxygen, suction and manual treatment of airway obstruction as needed for comfort.      05/21/2016 1717  Code Status History    Date Active Date Inactive Code Status Order ID Comments User Context   This patient has a current code status but no historical code status.    Advance Directive Documentation        Most Recent Value   Type of Advance Directive  Out of facility DNR (pink MOST or yellow form)   Pre-existing out of facility DNR  order (yellow form or pink MOST form)  Yellow form placed in chart (order not valid for inpatient use)   "MOST" Form in Place?             Consults GI, CArdiology  DVT ProphylaxisSCDs  Lab Results  Component Value Date   PLT 172 05/16/2016     Time Spent in minutes   critical care time spent   Greater than 50% of time spent in care coordination and counseling patient regarding the condition and plan of care.   Auburn Bilberry M.D on 05/16/2016 at 9:51 AM  Between 7am to 6pm - Pager - [phone]  After 6pm go to [email or web address] - password [email or web address] [facility]  [facility] [place] [facility]   Office  [phone]

## 2016-05-16 NOTE — Progress Notes (Signed)
Pharmacy Antibiotic Note  Amanda Mcdowell is a 82 y.o. female admitted on 05/30/2016 with sepsis.  Pharmacy has been consulted for vancomycin and Zosyn dosing.  Plan: DW 75.5kg  Vd 53L kei 0.028 hr-1  T1/2 25 hours Vancomycin 1250 mg q 36 hours ordered with stacked dosing. Level before 4th dose. Goal trough 15-20.  Zosyn 3.375 grams q 8 hours ordered.  Height: 5\' 6"  (167.6 cm) Weight: 166 lb 7.2 oz (75.5 kg) IBW/kg (Calculated) : 59.3  Temp (24hrs), Avg:97.5 F (36.4 C), Min:97.5 F (36.4 C), Max:97.6 F (36.4 C)   Recent Labs Lab 06/01/2016 1306 05/25/2016 1752 06/01/2016 2100 06/10/2016 2325  WBC 29.6*  --   --   --   CREATININE 1.43*  --   --   --   LATICACIDVEN  --  5.9* 5.1* 5.1*    Estimated Creatinine Clearance: 28.2 mL/min (by C-G formula based on Cr of 1.43).    Allergies  Allergen Reactions  . Atenolol Other (See Comments)    Unknown reaction.    Antimicrobials this admission: vancomycin  >>  Zosyn  >>   Dose adjustments this admission:   Microbiology results: 6/3 BCx: pending 6/3 UCx: pending  6/3 MRSA PCR: (-)   6/3 CXR: L base atelectasis 6/3 UA: LE (tr) NO2(-) WBC 0-5  Thank you for allowing pharmacy to be a part of this patient's care.  Anesha Hackert S 05/16/2016 12:48 AM

## 2016-05-16 NOTE — Progress Notes (Signed)
PULMONARY / CRITICAL CARE MEDICINE   Name: Amanda Mcdowell MRN: [id number] DOB: 07-02-1926    ADMISSION DATE:  06/06/2016   CONSULTATION DATE:  05/13/2016  REFERRING MD:  Hospitalist  CHIEF COMPLAINT: Hypotension, GIB and sepsis  HISTORY OF PRESENT ILLNESS:   This is an 82 yo AA female, SNF resident with a PMH of hypertension, COPD, Atrial flutter, dementia, anemia, CHF, depression and frequent UTIs who presented with hematemesis and bloody stools. History is obtained from ED records as patient has dementia and unable to relay the events that led to this hospitalization. Apparently patient was found vomiting blood at the nursing home where she lives. At the ED, patient's heart rate was in the 150s and her rhythm was A. fib flutter. She does have baseline history of a flutter she was started on a Cardizem infusion, but her blood pressure dropped so difficult infusion was stopped. She was given 2.5 L of fluids, but she remained tachycardic and mildly hypotensive. PCCM was consulted for further management. She continues to have tarry stools, but no hematemesis.   SUBJECTIVE: Remains in Aflutter with elevated HRs in the 140s-150s. Restarted on cardizem gtt. Blood pressure remains borderline with MAPs in the low to mid 60s. Hemoglobin trending down with persistent dark tarry stools   VITAL SIGNS: BP 95/62 mmHg  Pulse 109  Temp(Src) 98 F (36.7 C) (Axillary)  Resp 19  Ht 5\' 6"  (1.676 m)  Wt 166 lb 7.2 oz (75.5 kg)  BMI 26.88 kg/m2  SpO2 96%  HEMODYNAMICS:    VENTILATOR SETTINGS:    INTAKE / OUTPUT:    PHYSICAL EXAMINATION: General: Chronically ill-looking Neuro: Alert to person only, speech is slow, moves all extremities, age related changes in muscle strength HEENT: PERRLA, conjunctivae pale, trachea midline Cardiovascular: Tachycardic with heart rates in the 120s, S1, S2 audible. No murmur, regurg or gallop Lungs:  Bilateral airflow with diminished breath sounds bilaterally. No  rhonchi or wheezes Abdomen:  Nondistended, positive bowel sounds, pain with gentle palpation, worse in left upper and lower quadrants Musculoskeletal: Positive range of motion, mild contractures. Extremities: +2 pulses, trace edema Skin: Warm and dry  LABS:  BMET  Recent Labs Lab 05/14/2016 1306  NA 135  K 4.2  CL 96*  CO2 23  BUN 53*  CREATININE 1.43*  GLUCOSE 171*    Electrolytes  Recent Labs Lab 05/29/2016 1306 06/10/2016 1752  CALCIUM 9.0  --   MG  --  2.4    CBC  Recent Labs Lab 06/01/2016 1306 05/14/2016 1752 05/23/2016 2325  WBC 29.6*  --   --   HGB 10.3* 9.7* 8.5*  HCT 32.4*  --   --   PLT 251  --   --     Coag's  Recent Labs Lab 05/25/2016 1306  APTT 25  INR 1.16    Sepsis Markers  Recent Labs Lab 06/04/2016 1752 06/09/2016 2100 05/16/2016 2325  LATICACIDVEN 5.9* 5.1* 5.1*  PROCALCITON 2.71  --   --     ABG No results for input(s): PHART, PCO2ART, PO2ART in the last 168 hours.  Liver Enzymes  Recent Labs Lab 05/29/2016 1306  AST 30  ALT 17  ALKPHOS 74  BILITOT 0.8  ALBUMIN 2.9*    Cardiac Enzymes  Recent Labs Lab 05/30/2016 1306 06/03/2016 1752 05/24/2016 2325  TROPONINI 0.11* 0.29* 0.28*    Glucose  Recent Labs Lab 05/29/2016 1715  GLUCAP 109*    Imaging Dg Abd 1 View  05/25/2016  CLINICAL DATA:  Pain,  vomiting. EXAM: ABDOMEN - 1 VIEW COMPARISON:  None FINDINGS: Nonspecific bowel gas pattern. Mild dilatation of bowel in the right pelvis which is likely the cecum filled with stool. No convincing evidence for bowel obstruction. No organomegaly or free air. Gastrostomy tube projects over the stomach. Visualized lung bases are clear. IMPRESSION: Nonspecific bowel gas pattern without evidence for obstruction. Large stool burden in the right colon. Electronically Signed   By: Charlett Nose M.D.   On: 06/10/2016 13:53   Dg Chest Portable 1 View  06/01/2016  CLINICAL DATA:  Pain, vomiting. EXAM: PORTABLE CHEST 1 VIEW COMPARISON:  None. FINDINGS:  Mild cardiomegaly. Minimal left base atelectasis. Right lung is clear. No effusions. Heart is normal size. IMPRESSION: Left base atelectasis.  Mild cardiomegaly. Electronically Signed   By: Charlett Nose M.D.   On: 06/06/2016 13:53    STUDIES:  None  CULTURES: Pending Blood cultures 2. Urine cultures MRSA screen negative  ANTIBIOTICS: Vancomycin 06/11/2016 Zosyn 05/26/2016  SIGNIFICANT EVENTS: 06/10/2016: Admitted with acute GI bleed, hypovolemic shock, a flutter  LINES/TUBES: Peripheral IVs  DISCUSSION: 82 year old African-American female presenting with acute GI bleed, hypovolemic versus septic shock shock, sepsis, possibly of GI source, a flutter with rates in the 150s and elevated troponins  ASSESSMENT / PLAN:  PULMONARY A: History of COPD-patient is only on albuterol at home P:   -Supplemental oxygen as needed. -Nebulized bronchodilator  CARDIOVASCULAR A:  Shock-septic versus hypovolemic. History of hypertension A flutter with elevated heart rates in the 150s-refractory to IV fluids, and beta blockers. History of sick sinus syndrome. History of congestive heart failure-no recent echo. History of first-degree AV block Elevated troponin-0.1 to 0.28 P:  -Daily EKG -Diltiazem infusion. -IV fluids -Hemodynamics per ICU protocol -2-D echo to evaluate left ventricular function   RENAL A:   AKI P:   -IV fluids -Trend creatinine -Renally dose medications. -Monitor and replace electrolytes  GASTROINTESTINAL A:   Acute GI bleed Abdominal pain-generalized but more in the left lower and upper quadrants Dysphagia s/p gastrostomy tube P:   -Awaiting GI input. -Protonix infusion -Nothing by mouth -Abdominal x-ray HEMATOLOGIC A:   Acute blood loss anemia-hemoglobin dropped from 10.3-8.5 P:  -Transfuse 1 unit of packed red blood cells -Trend hemoglobin and hematocrit  INFECTIOUS A:   Sepsis of unknown source, most likely GI P:   -Broad-spectrum  antibiotics. -Follow-up cultures  ENDOCRINE A:   No acute issues  P:   -Monitor blood glucose with BMPs  NEUROLOGIC A:   History of dementia. History of depression P:   RASS goal: Not applicable -Supportive care   Disposition and family update: No family at bedside this a.m. Further changes in current treatment plan pending clinical course and diagnostics.  Best Practice: Code Status: DNR Diet: NPO GI prophylaxis:  PPI. VTE prophylaxis:  SCD's / contraindicated due to GI bleed  Magdalene S. [facility] ANP-BC Pulmonary and Critical Care Medicine [facility] Pager [phone] or [phone]  05/16/2016, 5:58 AM  Patient seen and examined with NP, agree with assessment and plan. Acute anemia due to GI bleeding, now appears him approved, GI has been consulted. Patient is awake and alert, does not appear to be in respiratory distress, has not required pressor support overnight. Course, complicated by atrial fibrillation with RVR. The patient is currently being maintained on a Cardizem drip, will continue., Cardiology service has been consulted.  Wells Guiles M.D. 05/16/2016   Critical Care Attestation.  I have personally obtained a history, examined the patient, evaluated  laboratory and imaging results, formulated the assessment and plan and placed orders. The Patient requires high complexity decision making for assessment and support, frequent evaluation and titration of therapies, application of advanced monitoring technologies and extensive interpretation of multiple databases. The patient has critical illness that could lead imminently to failure of 1 or more organ systems and requires the highest level of physician preparedness to intervene.  Critical Care Time devoted to patient care services described in this note is 35 minutes and is exclusive of time spent in procedures.

## 2016-05-16 NOTE — Progress Notes (Signed)
*  PRELIMINARY RESULTS* Echocardiogram 2D Echocardiogram has been performed.  Amanda Mcdowell 05/16/2016, 10:15 AM

## 2016-05-16 NOTE — Progress Notes (Signed)
eLink Physician-Brief Progress Note Patient Name: Amanda Mcdowell DOB: 03-29-1926 MRN: [id number]    S: RN calling eMD - A Fib v A Flutter RVR 130s. Normal BP. Bedside APP gave po lopresor and did not help. Cardizem push has helped. No active bleeind. Has persistent lactic acidosis  O" Calm, frail on cam care  A  Recent Labs Lab 2016-05-16 1306 May 16, 2016 1752 16-May-2016 2325  HGB 10.3* 9.7* 8.5*  HCT 32.4*  --   --   WBC 29.6*  --   --   PLT 251  --   --     Recent Labs Lab 05-16-16 1306 16-May-2016 1752  NA 135  --   K 4.2  --   CL 96*  --   CO2 23  --   GLUCOSE 171*  --   BUN 53*  --   CREATININE 1.43*  --   CALCIUM 9.0  --   MG  --  2.4    Recent Labs Lab 16-May-2016 1752 May 16, 2016 2100 May 16, 2016 2325  LATICACIDVEN 5.9* 5.1* 5.1*  PROCALCITON 2.71  --   --     A Persistent lacti acidosi A Fib / A Flutter RVR  P Fluid bolus - recheck lactate Cardizem gtt     Intervention Category Major Interventions: Arrhythmia - evaluation and management  Rosario Duey 05/16/2016, 12:56 AM

## 2016-05-16 NOTE — Consult Note (Signed)
Patient hgb drawn recently showed hgb of 9.2.  BP holding up appropriately

## 2016-05-16 NOTE — Progress Notes (Signed)
Pharmacy Antibiotic Note  Amanda Mcdowell is a 82 y.o. female admitted on 06/02/2016 with sepsis.  Pharmacy has been consulted for vancomycin and Zosyn dosing. GI bleed. From SNF  Plan: DW 75.5kg  Vd 53L kei 0.028 hr-1  T1/2 25 hours Vancomycin 1250 mg q 36 hours ordered with stacked dosing. Level before 4th dose. Goal trough 15-20.  Zosyn 3.375 grams q 8 hours ordered.   6/4- Scr improved 1.43 to 1.15. Will adjust Vancomycin dosing to 1 gram IV q24h. Ke=0.031  T1/2 22  Vd 46.  Will check trough on 6/7 at 0430.   Height: 5\' 6"  (167.6 cm) Weight: 166 lb 7.2 oz (75.5 kg) IBW/kg (Calculated) : 59.3  Temp (24hrs), Avg:98 F (36.7 C), Min:97.5 F (36.4 C), Max:98.6 F (37 C)   Recent Labs Lab 06/06/2016 1306 06/11/2016 1752 06/11/2016 2100 05/13/2016 2325 05/16/16 0539  WBC 29.6*  --   --   --  27.2*  CREATININE 1.43*  --   --   --  1.15*  LATICACIDVEN  --  5.9* 5.1* 5.1* 3.7*    Estimated Creatinine Clearance: 35.1 mL/min (by C-G formula based on Cr of 1.15).    Allergies  Allergen Reactions  . Atenolol Other (See Comments)    Unknown reaction.    Antimicrobials this admission: vancomycin  6/3>>  Zosyn 6/3 >>   Dose adjustments this admission:   Microbiology results: 6/3 BCx: pending 6/3 UCx: pending  6/3 MRSA PCR: (-)   6/3 CXR: L base atelectasis 6/3 UA: LE (tr) NO2(-) WBC 0-5  Thank you for allowing pharmacy to be a part of this patient's care.  Jaxtin Raimondo A 05/16/2016 11:57 AM

## 2016-05-16 NOTE — Progress Notes (Signed)
RN spoke with Dr. Mechele Collin and made MD aware that PEG tube was irrigated per his order and that upon aspiration of tube,  stringy, maroon, rusty clots came out. MD acknowledged and gave no new orders.

## 2016-05-16 NOTE — Consult Note (Signed)
[facility] Cardiology Consultation Note  Patient ID: Amanda Mcdowell, MRN: [id number], DOB/AGE: 08-03-1926 82 y.o. Admit date: 05/23/2016   Date of Consult: 05/16/2016 Primary Physician: No primary care provider on file. Primary Cardiologist:None  Chief Complaint:  Chief Complaint  Patient presents with  . Hematemesis   Reason for Consult: atrial flutter with elevated troponin  HPI: 82 y.o. female with known chronic kidney disease stage III and essential hypertension and recent GI bleed with a significant hemoglobin drop and hypotension with tachycardia. The patient was seen in the emergency room with dehydration and had an EKG showing atrial flutter with rapid ventricular rate with 2 to one block. The patient was tolerating it fairly well and had no evidence of heart failure and/or chest discomfort. Troponin was 0.28 more consistent with demand ischemia rather than acute coronary syndrome. Echocardiogram has shown normal LV systolic function with ejection fraction of 55-60% and no evidence of significant heart failure or previous damage. The patient is improving with blood cells and hydration.  Past Medical History  Diagnosis Date  . HTN (hypertension)   . AV block, 1st degree   . Atrial flutter (HCC)   . COPD (chronic obstructive pulmonary disease) (HCC)   . RA (rheumatoid arthritis) (HCC)   . Anemia   . Hyperlipidemia   . Heart failure (HCC)   . UTI (lower urinary tract infection)   . SSS (sick sinus syndrome) (HCC)   . Dementia   . Depression   . Appendicitis       Surgical History:  Past Surgical History  Procedure Laterality Date  . Peg placement    . Bowel obstruction surgery       Home Meds: Prior to Admission medications   Medication Sig Start Date End Date Taking? Authorizing Provider  acetaminophen (TYLENOL) 325 MG tablet 650 mg by Gastric Tube route every 6 (six) hours as needed for mild pain or moderate pain. *Note Dose*   Yes Historical Provider, MD   acetaminophen (TYLENOL) 650 MG suppository Place 650 mg rectally every 4 (four) hours as needed for fever. *Take for a temperature 100 or greater*   Yes Historical Provider, MD  albuterol (PROVENTIL HFA;VENTOLIN HFA) 108 (90 Base) MCG/ACT inhaler Inhale 2 puffs into the lungs every 6 (six) hours as needed for wheezing. *Note dose*   Yes Historical Provider, MD  bisacodyl (DULCOLAX) 10 MG suppository Place 10 mg rectally daily as needed for moderate constipation. PER MAR give if resident starts vomiting.   Yes Historical Provider, MD  bisacodyl (DULCOLAX) 10 MG suppository Place 10 mg rectally every Monday, Wednesday, and Friday. *Hold for loose stools*   Yes Historical Provider, MD  cholecalciferol (VITAMIN D) 1000 units tablet 1,000 Units by Gastric Tube route daily.   Yes Historical Provider, MD  clotrimazole-betamethasone (LOTRISONE) cream Apply 1 application topically 3 (three) times daily. Apply to rash on back til resolved.   Yes Historical Provider, MD  cyanocobalamin (,VITAMIN B-12,) 1000 MCG/ML injection Inject 1,000 mcg into the muscle every 30 (thirty) days.   Yes Historical Provider, MD  escitalopram (LEXAPRO) 10 MG tablet 10 mg by Gastric Tube route daily.   Yes Historical Provider, MD  linaclotide (LINZESS) 290 MCG CAPS capsule 290 mcg by Gastric Tube route daily. *Mix with 30 ml of water*   Yes Historical Provider, MD  lisinopril (PRINIVIL,ZESTRIL) 2.5 MG tablet 2.5 mg by Gastric Tube route daily.   Yes Historical Provider, MD  magnesium oxide (MAG-OX) 400 MG tablet 400 mg by Gastric Tube  route daily.   Yes Historical Provider, MD  metoprolol (LOPRESSOR) 50 MG tablet 50 mg by Gastric Tube route every 6 (six) hours. *hold for HR < 56*   Yes Historical Provider, MD  omeprazole (PRILOSEC OTC) 20 MG tablet 20 mg by Gastric Tube route daily.   Yes Historical Provider, MD  predniSONE (DELTASONE) 5 MG tablet 5 mg by Gastric Tube route daily.   Yes Historical Provider, MD  promethazine  (PHENERGAN) 25 MG/ML injection Inject 25 mg into the muscle once. Give If resident starts vomiting; hold TF x 4 hrs, and give dulcolax suppository.   Yes Historical Provider, MD  Skin Protectants, Misc. (EUCERIN) cream Apply 1 application topically 2 (two) times daily. *Apply to lower extremities and feet*   Yes Historical Provider, MD  traMADol (ULTRAM) 50 MG tablet 25 mg by Gastric Tube route every 4 (four) hours. Take for breakthrough pain. *Note Dose*   Yes Historical Provider, MD  Wheat Dextrin (BENEFIBER) POWD 5 mLs by Gastric Tube route daily. *Mix with 4-8 oz of water.*   Yes Historical Provider, MD    Inpatient Medications:  . sodium chloride   Intravenous Once  . ondansetron (ZOFRAN) IV  4 mg Intravenous Q6H  . piperacillin-tazobactam (ZOSYN)  IV  3.375 g Intravenous Q8H  . sodium chloride flush  3 mL Intravenous Q12H   . sodium chloride 75 mL/hr at 06/06/2016 2000  . diltiazem (CARDIZEM) infusion 10 mg/hr (05/16/16 1500)  . pantoprozole (PROTONIX) infusion 8 mg/hr (05/16/16 1500)    Allergies:  Allergies  Allergen Reactions  . Atenolol Other (See Comments)    Unknown reaction.    Social History   Social History  . Marital Status: Divorced    Spouse Name: N/A  . Number of Children: N/A  . Years of Education: N/A   Occupational History  . Not on file.   Social History Main Topics  . Smoking status: Never Smoker   . Smokeless tobacco: Not on file  . Alcohol Use: No  . Drug Use: No  . Sexual Activity: Not on file   Other Topics Concern  . Not on file   Social History Narrative  . No narrative on file     Family History  Problem Relation Age of Onset  . Family history unknown: Yes     Review of Systems Positive forChest pain shortness of breath Negative for: General:  chills, fever, night sweats or weight changes.  Cardiovascular: PND orthopnea syncope dizziness  Dermatological skin lesions rashes Respiratory: Cough congestion Urologic: Frequent  urination urination at night and hematuria Abdominal: negative for nausea, vomiting, diarrhea, positive for bright red blood per rectum, melena, negative for or hematemesis Neurologic: negative for visual changes, and/or hearing changes  All other systems reviewed and are otherwise negative except as noted above.  Labs:  Recent Labs  06/01/2016 1306 05/30/2016 1752 05/21/2016 2325 05/16/16 0539  TROPONINI 0.11* 0.29* 0.28* 0.19*   Lab Results  Component Value Date   WBC 24.2* 05/16/2016   HGB 9.2* 05/16/2016   HCT 28.5* 05/16/2016   MCV 87.9 05/16/2016   PLT 160 05/16/2016    Recent Labs Lab 05/16/16 0539  NA 138  K 4.0  CL 111  CO2 20*  BUN 48*  CREATININE 1.15*  CALCIUM 7.1*  PROT 4.8*  BILITOT 1.2  ALKPHOS 54  ALT 16  AST 32  GLUCOSE 129*   No results found for: CHOL, HDL, LDLCALC, TRIG No results found for: DDIMER  Radiology/Studies:  Dg  Abd 1 View  05/16/2016  CLINICAL DATA:  Abdominal pain EXAM: ABDOMEN - 1 VIEW COMPARISON:  08-Jun-2016 FINDINGS: Gastrostomy tube projects over the left abdomen. Nonobstructive bowel gas pattern. No free air organomegaly. No acute bony abnormality. Bilateral hip replacements noted. IMPRESSION: No acute findings. Electronically Signed   By: Charlett Nose M.D.   On: 05/16/2016 07:21   Dg Abd 1 View  06/08/2016  CLINICAL DATA:  Pain, vomiting. EXAM: ABDOMEN - 1 VIEW COMPARISON:  None FINDINGS: Nonspecific bowel gas pattern. Mild dilatation of bowel in the right pelvis which is likely the cecum filled with stool. No convincing evidence for bowel obstruction. No organomegaly or free air. Gastrostomy tube projects over the stomach. Visualized lung bases are clear. IMPRESSION: Nonspecific bowel gas pattern without evidence for obstruction. Large stool burden in the right colon. Electronically Signed   By: Charlett Nose M.D.   On: 06-08-2016 13:53   Dg Chest Portable 1 View  2016/06/08  CLINICAL DATA:  Pain, vomiting. EXAM: PORTABLE CHEST 1 VIEW  COMPARISON:  None. FINDINGS: Mild cardiomegaly. Minimal left base atelectasis. Right lung is clear. No effusions. Heart is normal size. IMPRESSION: Left base atelectasis.  Mild cardiomegaly. Electronically Signed   By: Charlett Nose M.D.   On: 2016-06-08 13:53    IWL:NLGXQJ flutter with variable heart rate with nonspecific ST and T-wave changes  Weights: Filed Weights   06/08/16 1237 06-08-2016 1700  Weight: 180 lb (81.647 kg) 166 lb 7.2 oz (75.5 kg)     Physical Exam: Blood pressure 103/58, pulse 44, temperature 98.8 F (37.1 C), temperature source Axillary, resp. rate 22, height 5\' 6"  (1.676 m), weight 166 lb 7.2 oz (75.5 kg), SpO2 100 %. Body mass index is 26.88 kg/(m^2). General: Well developed, well nourished, in no acute distress. Head eyes ears nose throat: Normocephalic, atraumatic, sclera non-icteric, no xanthomas, nares are without discharge. No apparent thyromegaly and/or mass  Lungs: Normal respiratory effort. Some wheezes, no rales, no rhonchi.  Heart: Irregular with normal S1 S2. no murmur gallop, no rub, PMI is normal size and placement, carotid upstroke normal without bruit, jugular venous pressure is normal Abdomen: Soft, non-tender, non-distended with normoactive bowel sounds. No hepatomegaly. No rebound/guarding. No obvious abdominal masses. Abdominal aorta is normal size without bruit Extremities: Trace edema. no cyanosis, no clubbing, no ulcers  Peripheral : 2+ bilateral upper extremity pulses, 2+ bilateral femoral pulses, 2+ bilateral dorsal pedal pulse Neuro: Alert and oriented. No facial asymmetry. No focal deficit. Moves all extremities spontaneously. Musculoskeletal: Normal muscle tone without kyphosis Psych:  Responds to questions appropriately with a normal affect.    Assessment: 82 year old female with chronic kidney disease stage III with acute GI bleed likely causing atrial flutter with rapid ventricular rate and elevated troponin consistent with demand  ischemia now stable at this time with appropriate conservative therapy.  Plan: 1. Continue IV hydration as well as packed red blood cells as necessary 2. No further cardiac intervention at this time due to no evidence of myocardial infarction or heart failure excellent 3. Diltiazem drip for heart rate control while patient recovering in the ICU 3. Possible change to oral diltiazem long acting for heart rate control while completely recovering from GI bleed 4. Patient is at low risk for cardiovascular complication with upper endoscopy or colonoscopy and would proceed without restriction  Signed, Lamar Blinks M.D. [facility] Cardiology 05/16/2016, 3:07 PM

## 2016-05-16 NOTE — Progress Notes (Signed)
1 unit pRBC complete, per Dr. Markham Jordan, repeat Hgb STAT and call w results

## 2016-05-17 DIAGNOSIS — D649 Anemia, unspecified: Secondary | ICD-10-CM

## 2016-05-17 DIAGNOSIS — A419 Sepsis, unspecified organism: Secondary | ICD-10-CM

## 2016-05-17 DIAGNOSIS — I959 Hypotension, unspecified: Secondary | ICD-10-CM

## 2016-05-17 LAB — CBC WITH DIFFERENTIAL/PLATELET
Basophils Absolute: 0 10*3/uL (ref 0–0.1)
Basophils Relative: 0 %
EOS ABS: 0 10*3/uL (ref 0–0.7)
HCT: 25.1 % — ABNORMAL LOW (ref 35.0–47.0)
Hemoglobin: 8 g/dL — ABNORMAL LOW (ref 12.0–16.0)
LYMPHS ABS: 0.3 10*3/uL — AB (ref 1.0–3.6)
MCH: 28.1 pg (ref 26.0–34.0)
MCHC: 32 g/dL (ref 32.0–36.0)
MCV: 87.9 fL (ref 80.0–100.0)
MONO ABS: 0.6 10*3/uL (ref 0.2–0.9)
Monocytes Relative: 4 %
Neutro Abs: 15.6 10*3/uL — ABNORMAL HIGH (ref 1.4–6.5)
Neutrophils Relative %: 94 %
PLATELETS: 152 10*3/uL (ref 150–440)
RBC: 2.86 MIL/uL — ABNORMAL LOW (ref 3.80–5.20)
RDW: 17.8 % — AB (ref 11.5–14.5)
WBC: 16.5 10*3/uL — ABNORMAL HIGH (ref 3.6–11.0)

## 2016-05-17 LAB — BASIC METABOLIC PANEL
Anion gap: 6 (ref 5–15)
BUN: 37 mg/dL — AB (ref 6–20)
CALCIUM: 7.1 mg/dL — AB (ref 8.9–10.3)
CO2: 19 mmol/L — ABNORMAL LOW (ref 22–32)
CREATININE: 1.06 mg/dL — AB (ref 0.44–1.00)
Chloride: 119 mmol/L — ABNORMAL HIGH (ref 101–111)
GFR calc Af Amer: 53 mL/min — ABNORMAL LOW (ref >60)
GFR, EST NON AFRICAN AMERICAN: 45 mL/min — AB (ref >60)
GLUCOSE: 134 mg/dL — AB (ref 65–99)
Potassium: 3.7 mmol/L (ref 3.5–5.1)
Sodium: 144 mmol/L (ref 135–145)

## 2016-05-17 LAB — PREPARE RBC (CROSSMATCH)

## 2016-05-17 LAB — PHOSPHORUS: PHOSPHORUS: 3.2 mg/dL (ref 2.5–4.6)

## 2016-05-17 LAB — LACTIC ACID, PLASMA: Lactic Acid, Venous: 1.9 mmol/L (ref 0.5–2.0)

## 2016-05-17 LAB — MAGNESIUM: Magnesium: 2.1 mg/dL (ref 1.7–2.4)

## 2016-05-17 MED ORDER — NOREPINEPHRINE BITARTRATE 1 MG/ML IV SOLN: 2.0000 ug/min | INTRAVENOUS | Status: DC

## 2016-05-17 MED ORDER — SODIUM CHLORIDE 0.9 % IV SOLN: Freq: Once | INTRAVENOUS | Status: DC

## 2016-05-17 MED ORDER — SODIUM CHLORIDE 0.9 % IV SOLN
Freq: Once | INTRAVENOUS | Status: AC
  Administered 2016-05-17: 14:00:00 via INTRAVENOUS

## 2016-05-17 MED ORDER — SODIUM CHLORIDE 0.9 % IV BOLUS (SEPSIS)
1000.0000 mL | Freq: Once | INTRAVENOUS | Status: AC
  Administered 2016-05-17: 1000 mL via INTRAVENOUS

## 2016-05-17 MED ORDER — SODIUM CHLORIDE 0.9 % IV SOLN
Freq: Once | INTRAVENOUS | Status: AC
  Administered 2016-05-17: 16:00:00 via INTRAVENOUS

## 2016-05-17 MED ORDER — NOREPINEPHRINE 4 MG/250ML-% IV SOLN
2.0000 ug/min | INTRAVENOUS | Status: DC
  Administered 2016-05-17: 2 ug/min via INTRAVENOUS
  Filled 2016-05-17: qty 250

## 2016-05-17 NOTE — Consult Note (Signed)
Patient with slow gi bleed, epigastric and LLQ tenderness.  Hgb drifting down was 8 this morning.  I ordered a unit of FFP after next RBC transfusion.  She has elevated WBC. Lactic acid level high on admission, falling over last few days.  She had lavage yesterday and this showed some blood in stomach.  She is very high risk cardiopul for any endoscopic procedure.  Might tolerate an EGD but cannot see her doing a colonoscopy given her over all health.

## 2016-05-17 NOTE — Care Management (Signed)
Patient is from [facility].

## 2016-05-17 NOTE — Progress Notes (Signed)
[facility] Cardiology [facility] Encounter Note  Patient: Amanda Mcdowell / Admit Date: May 28, 2016 / Date of Encounter: 05/17/2016, 7:48 AM   Subjective: Patient has a significant improvement in heart rate control. No evidence of further bleeding. Patient is hemodynamically stable. No evidence of chest pain or shortness of breath. Echocardiogram shows normal LV systolic function and no evidence of previous myocardial infarction and or heart failure  Review of Systems: Positive for: Weakness Negative for: Vision change, hearing change, syncope, dizziness, nausea, vomiting,diarrhea, bloody stool, stomach pain, cough, congestion, diaphoresis, urinary frequency, urinary pain,skin lesions, skin rashes Others previously listed  Objective: Telemetry: Atrial fibrillation with controlled ventricular rate Physical Exam: Blood pressure 108/51, pulse 41, temperature 98.7 F (37.1 C), temperature source Oral, resp. rate 19, height 5\' 6"  (1.676 m), weight 166 lb 7.2 oz (75.5 kg), SpO2 100 %. Body mass index is 26.88 kg/(m^2). General: Well developed, well nourished, in no acute distress. Head: Normocephalic, atraumatic, sclera non-icteric, no xanthomas, nares are without discharge. Neck: No apparent masses Lungs: Normal respirations with few wheezes, some rhonchi, no rales , no crackles   Heart: Irregular rate and rhythm, normal S1 S2, no murmur, no rub, no gallop, PMI is normal size and placement, carotid upstroke normal without bruit, jugular venous pressure normal Abdomen: Soft, non-tender, non-distended with normoactive bowel sounds. No hepatosplenomegaly. Abdominal aorta is normal size without bruit Extremities: Trace edema, no clubbing, no cyanosis, no ulcers,  Peripheral: 2+ radial, 2+ femoral, 2+ dorsal pedal pulses Neuro: Alert and oriented. Moves all extremities spontaneously. Psych:  Responds to questions appropriately with a normal affect.   Intake/Output Summary (Last 24 hours) at 05/17/16  0748 Last data filed at 05/17/16 0634  Gross per 24 hour  Intake 3285.5 ml  Output    590 ml  Net 2695.5 ml    Inpatient Medications:  . sodium chloride   Intravenous Once  . ondansetron (ZOFRAN) IV  4 mg Intravenous Q6H  . piperacillin-tazobactam (ZOSYN)  IV  3.375 g Intravenous Q8H  . sodium chloride flush  3 mL Intravenous Q12H   Infusions:  . sodium chloride 75 mL/hr at 05/16/16 1832  . diltiazem (CARDIZEM) infusion 10 mg/hr (05/16/16 2200)  . norepinephrine 2 mcg/min (05/17/16 0428)  . pantoprozole (PROTONIX) infusion 8 mg/hr (05/16/16 2311)    Labs:  Recent Labs  05/16/16 0539 05/17/16 0436  NA 138 144  K 4.0 3.7  CL 111 119*  CO2 20* 19*  GLUCOSE 129* 134*  BUN 48* 37*  CREATININE 1.15* 1.06*  CALCIUM 7.1* 7.1*  MG 2.1 2.1  PHOS 3.2 3.2    Recent Labs  May 28, 2016 1306 05/16/16 0539  AST 30 32  ALT 17 16  ALKPHOS 74 54  BILITOT 0.8 1.2  PROT 6.8 4.8*  ALBUMIN 2.9* 2.1*    Recent Labs  05/16/16 0539 05/16/16 1326 05/17/16 0436  WBC 27.2* 24.2* 16.5*  NEUTROABS 25.8*  --  15.6*  HGB 8.2* 9.2* 8.0*  HCT 25.3* 28.5* 25.1*  MCV 88.0 87.9 87.9  PLT 172 160 152    Recent Labs  05/28/2016 1306 05-28-16 1752 May 28, 2016 2325 05/16/16 0539  TROPONINI 0.11* 0.29* 0.28* 0.19*   Invalid input(s): POCBNP No results for input(s): HGBA1C in the last 72 hours.   Weights: Filed Weights   May 28, 2016 1237 May 28, 2016 1700  Weight: 180 lb (81.647 kg) 166 lb 7.2 oz (75.5 kg)     Radiology/Studies:  Dg Abd 1 View  05/16/2016  CLINICAL DATA:  Abdominal pain EXAM: ABDOMEN - 1  VIEW COMPARISON:  06/11/2016 FINDINGS: Gastrostomy tube projects over the left abdomen. Nonobstructive bowel gas pattern. No free air organomegaly. No acute bony abnormality. Bilateral hip replacements noted. IMPRESSION: No acute findings. Electronically Signed   By: Charlett Nose M.D.   On: 05/16/2016 07:21   Dg Abd 1 View  05/21/2016  CLINICAL DATA:  Pain, vomiting. EXAM: ABDOMEN - 1 VIEW  COMPARISON:  None FINDINGS: Nonspecific bowel gas pattern. Mild dilatation of bowel in the right pelvis which is likely the cecum filled with stool. No convincing evidence for bowel obstruction. No organomegaly or free air. Gastrostomy tube projects over the stomach. Visualized lung bases are clear. IMPRESSION: Nonspecific bowel gas pattern without evidence for obstruction. Large stool burden in the right colon. Electronically Signed   By: Charlett Nose M.D.   On: 05/24/2016 13:53   Dg Chest Portable 1 View  05/28/2016  CLINICAL DATA:  Pain, vomiting. EXAM: PORTABLE CHEST 1 VIEW COMPARISON:  None. FINDINGS: Mild cardiomegaly. Minimal left base atelectasis. Right lung is clear. No effusions. Heart is normal size. IMPRESSION: Left base atelectasis.  Mild cardiomegaly. Electronically Signed   By: Charlett Nose M.D.   On: 05/20/2016 13:53     Assessment and Recommendation  82 y.o. female with essential hypertension mixed hyperlipidemia and chronic kidney disease stage III with the GI bleed of unknown origin now with atrial fibrillation flutter with rapid ventricular rate now that her control with diltiazem drip 1. Continue diltiazem drip for heart rate control with a goal heart rate under 100 bpm 2. No anticoagulation due to concerns of bleeding 3. Possible oral medication management including diltiazem for heart rate control and possible move to the floor if needed 4. Proceed to upper endoscopy lower colonoscopy to assess for causes of bleeding without restriction. Patient is at low risk for cardiovascular complication with this procedure 5. No further cardiac diagnostics necessary at this time  Signed, Arnoldo Hooker M.D. FACC

## 2016-05-17 NOTE — Progress Notes (Signed)
[facility] [place] at [facility]                                                                                                                                                                                            Patient Demographics   Amanda Mcdowell, is a 82 y.o. female, DOB - [birth year], NBV:[id number]  Admit date - 05/13/2016   Admitting Physician Shaune Pollack, MD  Outpatient Primary MD for the patient is No primary care provider on file.   LOS - 2  Subjective:Patient's hemoglobin has drifted down. Still on low-dose Levophed continues to complain of abdominal pain.    Review of Systems:   CONSTITUTIONAL: Unable to provide due to confusion  Vitals:   Filed Vitals:   05/17/16 0430 05/17/16 0500 05/17/16 0600 05/17/16 0800  BP: 107/75 116/53 108/51 103/47  Pulse:      Temp:      TempSrc:      Resp:  18 19 18   Height:      Weight:      SpO2:  98% 100%     Wt Readings from Last 3 Encounters:  06/02/2016 75.5 kg (166 lb 7.2 oz)     Intake/Output Summary (Last 24 hours) at 05/17/16 1337 Last data filed at 05/17/16 0800  Gross per 24 hour  Intake 2365.5 ml  Output    550 ml  Net 1815.5 ml    Physical Exam:   GENERAL: Critically ill-appearing HEAD, EYES, EARS, NOSE AND THROAT: Atraumatic, normocephalic. Pale conjunctiva. Pupils equal and reactive to light. Sclerae anicteric.  No oro-pharyngeal erythema.  NECK: Supple. There is no jugular venous distention. No bruits, no lymphadenopathy, no thyromegaly.  HEART: Irregularly irregular. No murmurs, no rubs, no clicks.  LUNGS: Clear to auscultation bilaterally. No rales or rhonchi. No wheezes.  ABDOMEN: Soft, flat, epigastric tenderness, nondistended. Has good bowel sounds. No hepatosplenomegaly appreciated.  EXTREMITIES: No evidence of any cyanosis, clubbing, or peripheral edema.  +2 pedal and radial pulses bilaterally.  NEUROLOGIC:Currently drowsy  SKIN: Moist and warm with no rashes  appreciated.  Psych: Not anxious, depressed LN: No inguinal LN enlargement    Antibiotics   Anti-infectives    Start     Dose/Rate Route Frequency Ordered Stop   05/17/16 0500  vancomycin (VANCOCIN) IVPB 1000 mg/200 mL premix  Status:  Discontinued     1,000 mg 200 mL/hr over 60 Minutes Intravenous Every 24 hours 05/16/16 1156 05/16/16 1426   05/16/16 0300  vancomycin (VANCOCIN) 1,250 mg in sodium chloride 0.9 % 250 mL IVPB  Status:  Discontinued     1,250 mg 166.7 mL/hr  over 90 Minutes Intravenous Every 36 hours 05/16/16 0048 05/16/16 1156   06/03/2016 1800  vancomycin (VANCOCIN) IVPB 1000 mg/200 mL premix     1,000 mg 200 mL/hr over 60 Minutes Intravenous  Once 06/06/2016 1717 05/27/2016 2030   06/06/2016 1800  piperacillin-tazobactam (ZOSYN) IVPB 3.375 g     3.375 g 12.5 mL/hr over 240 Minutes Intravenous Every 8 hours 06/06/2016 1733     05/16/2016 1730  piperacillin-tazobactam (ZOSYN) IVPB 3.375 g  Status:  Discontinued     3.375 g 100 mL/hr over 30 Minutes Intravenous  Once 05/14/2016 1717 05/18/2016 1732   05/21/2016 1630  piperacillin-tazobactam (ZOSYN) IVPB 3.375 g  Status:  Discontinued     3.375 g 100 mL/hr over 30 Minutes Intravenous  Once 05/26/2016 1559 06/10/2016 1733   05/21/2016 1630  vancomycin (VANCOCIN) IVPB 1000 mg/200 mL premix  Status:  Discontinued     1,000 mg 200 mL/hr over 60 Minutes Intravenous  Once 06/06/2016 1602 05/21/2016 1734      Medications   Scheduled Meds: . sodium chloride   Intravenous Once  . sodium chloride   Intravenous Once  . sodium chloride   Intravenous Once  . ondansetron (ZOFRAN) IV  4 mg Intravenous Q6H  . piperacillin-tazobactam (ZOSYN)  IV  3.375 g Intravenous Q8H  . sodium chloride flush  3 mL Intravenous Q12H   Continuous Infusions: . sodium chloride 75 mL/hr at 05/16/16 1832  . diltiazem (CARDIZEM) infusion 8 mg/hr (05/17/16 0800)  . norepinephrine 2 mcg/min (05/17/16 0800)  . pantoprozole (PROTONIX) infusion 8 mg/hr (05/16/16 2311)   PRN  Meds:.albuterol, morphine injection, ondansetron **OR** [DISCONTINUED] ondansetron (ZOFRAN) IV   Data Review:   Micro Results Recent Results (from the past 240 hour(s))  Urine culture     Status: None   Collection Time: 05/25/2016  4:00 PM  Result Value Ref Range Status   Specimen Description URINE, RANDOM  Final   Special Requests NONE  Final   Culture NO GROWTH Performed at [facility]   Final   Report Status 05/16/2016 FINAL  Final  MRSA PCR Screening     Status: None   Collection Time: 06/03/2016  5:51 PM  Result Value Ref Range Status   MRSA by PCR NEGATIVE NEGATIVE Final    Comment:        The GeneXpert MRSA Assay (FDA approved for NASAL specimens only), is one component of a comprehensive MRSA colonization surveillance program. It is not intended to diagnose MRSA infection nor to guide or monitor treatment for MRSA infections.   Culture, blood (x 2)     Status: None (Preliminary result)   Collection Time: 05/28/2016  5:52 PM  Result Value Ref Range Status   Specimen Description BLOOD LEFT AC  Final   Special Requests   Final    BOTTLES DRAWN AEROBIC AND ANAEROBIC  ANA , AER 2 ML   Culture NO GROWTH 2 DAYS  Final   Report Status PENDING  Incomplete  Culture, blood (x 2)     Status: None (Preliminary result)   Collection Time: 05/23/2016  7:17 PM  Result Value Ref Range Status   Specimen Description BLOOD LEFT WRIST  Final   Special Requests   Final    BOTTLES DRAWN AEROBIC AND ANAEROBIC AEROBIC 15CC, ANAEROBIC 10CC   Culture NO GROWTH 2 DAYS  Final   Report Status PENDING  Incomplete    Radiology Reports Dg Abd 1 View  05/16/2016  CLINICAL DATA:  Abdominal pain EXAM: ABDOMEN -  1 VIEW COMPARISON:  05/13/2016 FINDINGS: Gastrostomy tube projects over the left abdomen. Nonobstructive bowel gas pattern. No free air organomegaly. No acute bony abnormality. Bilateral hip replacements noted. IMPRESSION: No acute findings. Electronically Signed   By: Charlett Nose  M.D.   On: 05/16/2016 07:21   Dg Abd 1 View  06/03/2016  CLINICAL DATA:  Pain, vomiting. EXAM: ABDOMEN - 1 VIEW COMPARISON:  None FINDINGS: Nonspecific bowel gas pattern. Mild dilatation of bowel in the right pelvis which is likely the cecum filled with stool. No convincing evidence for bowel obstruction. No organomegaly or free air. Gastrostomy tube projects over the stomach. Visualized lung bases are clear. IMPRESSION: Nonspecific bowel gas pattern without evidence for obstruction. Large stool burden in the right colon. Electronically Signed   By: Charlett Nose M.D.   On: 06/09/2016 13:53   Dg Chest Portable 1 View  05/17/2016  CLINICAL DATA:  Pain, vomiting. EXAM: PORTABLE CHEST 1 VIEW COMPARISON:  None. FINDINGS: Mild cardiomegaly. Minimal left base atelectasis. Right lung is clear. No effusions. Heart is normal size. IMPRESSION: Left base atelectasis.  Mild cardiomegaly. Electronically Signed   By: Charlett Nose M.D.   On: 05/16/2016 13:53     CBC  Recent Labs Lab 06/10/2016 1306 06/11/2016 1752 05/24/2016 2325 05/16/16 0539 05/16/16 1326 05/17/16 0436  WBC 29.6*  --   --  27.2* 24.2* 16.5*  HGB 10.3* 9.7* 8.5* 8.2* 9.2* 8.0*  HCT 32.4*  --   --  25.3* 28.5* 25.1*  PLT 251  --   --  172 160 152  MCV 87.0  --   --  88.0 87.9 87.9  MCH 27.7  --   --  28.6 28.3 28.1  MCHC 31.8*  --   --  32.5 32.2 32.0  RDW 18.3*  --   --  17.7* 17.6* 17.8*  LYMPHSABS 0.8*  --   --  0.7*  --  0.3*  MONOABS 1.4*  --   --  0.7  --  0.6  EOSABS 0.0  --   --  0.0  --  0.0  BASOSABS 0.1  --   --  0.0  --  0.0    Chemistries   Recent Labs Lab 05/13/2016 1306 05/26/2016 1752 05/16/16 0539 05/17/16 0436  NA 135  --  138 144  K 4.2  --  4.0 3.7  CL 96*  --  111 119*  CO2 23  --  20* 19*  GLUCOSE 171*  --  129* 134*  BUN 53*  --  48* 37*  CREATININE 1.43*  --  1.15* 1.06*  CALCIUM 9.0  --  7.1* 7.1*  MG  --  2.4 2.1 2.1  AST 30  --  32  --   ALT 17  --  16  --   ALKPHOS 74  --  54  --   BILITOT 0.8  --   1.2  --    ------------------------------------------------------------------------------------------------------------------ estimated creatinine clearance is 38.1 mL/min (by C-G formula based on Cr of 1.06). ------------------------------------------------------------------------------------------------------------------ No results for input(s): HGBA1C in the last 72 hours. ------------------------------------------------------------------------------------------------------------------ No results for input(s): CHOL, HDL, LDLCALC, TRIG, CHOLHDL, LDLDIRECT in the last 72 hours. ------------------------------------------------------------------------------------------------------------------ No results for input(s): TSH, T4TOTAL, T3FREE, THYROIDAB in the last 72 hours.  Invalid input(s): FREET3 ------------------------------------------------------------------------------------------------------------------ No results for input(s): VITAMINB12, FOLATE, FERRITIN, TIBC, IRON, RETICCTPCT in the last 72 hours.  Coagulation profile  Recent Labs Lab 05/16/2016 1306 05/16/16 0539  INR 1.16 1.23    No  results for input(s): DDIMER in the last 72 hours.  Cardiac Enzymes  Recent Labs Lab May 23, 2016 1752 May 23, 2016 2325 05/16/16 0539  TROPONINI 0.29* 0.28* 0.19*   ------------------------------------------------------------------------------------------------------------------ Invalid input(s): POCBNP    Assessment & Plan   Patient is a 82 year old African-American female presenting with upper GI bleed  1. UPPER GI bleeding and anemia due to acute blood loss Continue IV Protonix drip Hemoglobins trended down blood pressure little low  give 1 unit of packed RBCs   2. A flutter with RVR,  Continue IV Cardizem drip heart rate improved Appreciate cardiology input  3. Sepsis and hypotension In [place] Levophed WBC tender down , if improved I will obtain CT of the abdomen  Urinalysis  reviewed chest x-ray reviewed which were negative Continue empiric Zosyn and vancomycin  4. Acute renal failure  improved  5. Elevated troponin due to demand ischemia  6. Miscellaneous SCDs for DVT prophylaxis  7. Code DO NOT RESUSCITATE     Code Status Orders        Start     Ordered   May 23, 2016 1718  Do not attempt resuscitation (DNR)   Continuous    Question Answer Comment  In the event of cardiac or respiratory ARREST Do not call a "code blue"   In the event of cardiac or respiratory ARREST Do not perform Intubation, CPR, defibrillation or ACLS   In the event of cardiac or respiratory ARREST Use medication by any route, position, wound care, and other measures to relive pain and suffering. May use oxygen, suction and manual treatment of airway obstruction as needed for comfort.      2016/05/23 1717    Code Status History    Date Active Date Inactive Code Status Order ID Comments User Context   This patient has a current code status but no historical code status.    Advance Directive Documentation        Most Recent Value   Type of Advance Directive  Out of facility DNR (pink MOST or yellow form)   Pre-existing out of facility DNR order (yellow form or pink MOST form)  Yellow form placed in chart (order not valid for inpatient use)   "MOST" Form in Place?             Consults GI, CArdiology  DVT ProphylaxisSCDs  Lab Results  Component Value Date   PLT 152 05/17/2016     Time Spent in minutes  critical care time spent   Greater than 50% of time spent in care coordination and counseling patient regarding the condition and plan of care.   Auburn Bilberry M.D on 05/17/2016 at 1:37 PM  Between 7am to 6pm - Pager - [phone]  After 6pm go to [email or web address] - password [email or web address] [facility]  [facility] [place]   Office  [phone]

## 2016-05-17 NOTE — Clinical Social Work Note (Signed)
Clinical Social Work Assessment  Patient Details  Name: Amanda Mcdowell MRN: [id number] Date of Birth: 01-02-1926  Date of referral:  05/17/16               Reason for consult:  Facility Placement                Permission sought to share information with:    Permission granted to share information::   (patient sleeping through assessment)  Name::        Agency::     Relationship::     Contact Information:     Housing/Transportation Living arrangements for the past 2 months:  Skilled Nursing Facility Source of Information:  Adult Children Patient Interpreter Needed:  None Criminal Activity/Legal Involvement Pertinent to Current Situation/Hospitalization:  No - Comment as needed Significant Relationships:  Adult Children Lives with:  Facility Resident Do you feel safe going back to the place where you live?  Yes Need for family participation in patient care:  Yes (Comment)  Care giving concerns:  Patient is a resident of [facility] and is total care.   Social Worker assessment / plan:  CSW informed patient admitted from [facility]. CSW spoke with patient's daughter who was at her bedside this afternoon, Mindi Slicker. Ms. Jae Dire stated that patient has been a resident at [facility] for 3 years. She stated that she would always prefer her mother return home rather than [facility], but she stated that she is pleased with the care patient has received from [facility] and wishes for her to return.  Employment status:  Retired Database administrator PT Recommendations:  Not assessed at this time Information / Referral to community resources:  Skilled Nursing Facility  Patient/Family's Response to care:  Patient's daughter expressed appreciation for CSW visit and also stated that the care she has received here at [facility] has been excellent.   Patient/Family's Understanding of and Emotional Response to Diagnosis, Current Treatment, and Prognosis:  Patient's daughter is involved with  the care of her mother and decisions made.   Emotional Assessment Appearance:  Appears stated age Attitude/Demeanor/Rapport:  Lethargic Affect (typically observed):  Unable to Assess Orientation:    Alcohol / Substance use:  Not Applicable Psych involvement (Current and /or in the community):  No (Comment)  Discharge Needs  Concerns to be addressed:  Care Coordination Readmission within the last 30 days:  No Current discharge risk:  None Barriers to Discharge:  No Barriers Identified   York Spaniel, LCSW 05/17/2016, 3:08 PM

## 2016-05-17 NOTE — Progress Notes (Signed)
PULMONARY / CRITICAL CARE MEDICINE   Name: Amanda Mcdowell MRN: [id number] DOB: 29-Oct-1926    ADMISSION DATE:  05/18/2016   CONSULTATION DATE:  05/25/2016  REFERRING MD:  Hospitalist  CHIEF COMPLAINT: Hypotension, GIB and sepsis  HISTORY OF PRESENT ILLNESS:   This is an 82 yo AA female, SNF resident with a PMH of hypertension, COPD, Atrial flutter, dementia, anemia, CHF, depression and frequent UTIs who presented with hematemesis and bloody stools. History is obtained from ED records as patient has dementia and unable to relay the events that led to this hospitalization. Apparently patient was found vomiting blood at the nursing home where she lives. At the ED, patient's heart rate was in the 150s and her rhythm was A. fib flutter. She does have baseline history of a flutter she was started on a Cardizem infusion, but her blood pressure dropped so difficult infusion was stopped. She was given 2.5 L of fluids, but she remained tachycardic and mildly hypotensive. PCCM was consulted for further management. She continues to have tarry stools, but no hematemesis.   SUBJECTIVE: Remains in Aflutter but HRs improved to 110s.Still on cardizem gtt. Blood pressure low and persistent dark tarry stools  VITAL SIGNS: BP 108/51 mmHg  Pulse 41  Temp(Src) 98.7 F (37.1 C) (Oral)  Resp 19  Ht 5\' 6"  (1.676 m)  Wt 166 lb 7.2 oz (75.5 kg)  BMI 26.88 kg/m2  SpO2 100%  HEMODYNAMICS:    VENTILATOR SETTINGS:    INTAKE / OUTPUT: I/O last 3 completed shifts: In: 6664.2 [I.V.:2799.2; Blood:445; Other:120; IV Piggyback:3300] Out: 40 [Drains:40]  PHYSICAL EXAMINATION: General: Chronically ill-looking Neuro: Alert to person only, speech is slow, moves all extremities, age related changes in muscle strength HEENT: PERRLA, conjunctivae pale, trachea midline Cardiovascular: Tachycardic with heart rates in the 110s, S1, S2 audible. No murmur, regurg or gallop Lungs:  Bilateral airflow with diminished breath  sounds bilaterally. No rhonchi or wheezes Abdomen:  Nondistended, positive bowel sounds, pain with gentle palpation, worse in left upper and lower quadrants Musculoskeletal: Positive range of motion, mild contractures. Extremities: +2 pulses, trace edema Skin: Warm and dry  LABS:  BMET  Recent Labs Lab 05/21/2016 1306 05/16/16 0539 05/17/16 0436  NA 135 138 144  K 4.2 4.0 3.7  CL 96* 111 119*  CO2 23 20* 19*  BUN 53* 48* 37*  CREATININE 1.43* 1.15* 1.06*  GLUCOSE 171* 129* 134*    Electrolytes  Recent Labs Lab 05/13/2016 1306 05/14/2016 1752 05/16/16 0539 05/17/16 0436  CALCIUM 9.0  --  7.1* 7.1*  MG  --  2.4 2.1 2.1  PHOS  --   --  3.2 3.2    CBC  Recent Labs Lab 05/16/16 0539 05/16/16 1326 05/17/16 0436  WBC 27.2* 24.2* 16.5*  HGB 8.2* 9.2* 8.0*  HCT 25.3* 28.5* 25.1*  PLT 172 160 152    Coag's  Recent Labs Lab 06/02/2016 1306 05/16/16 0539  APTT 25  --   INR 1.16 1.23    Sepsis Markers  Recent Labs Lab 05/21/2016 1752  05/14/2016 2325 05/16/16 0539 05/17/16 0436  LATICACIDVEN 5.9*  < > 5.1* 3.7* 1.9  PROCALCITON 2.71  --   --   --   --   < > = values in this interval not displayed.  ABG No results for input(s): PHART, PCO2ART, PO2ART in the last 168 hours.  Liver Enzymes  Recent Labs Lab 05/31/2016 1306 05/16/16 0539  AST 30 32  ALT 17 16  ALKPHOS 74 54  BILITOT 0.8 1.2  ALBUMIN 2.9* 2.1*    Cardiac Enzymes  Recent Labs Lab 05/16/2016 1752 May 16, 2016 2325 05/16/16 0539  TROPONINI 0.29* 0.28* 0.19*    Glucose  Recent Labs Lab 05-16-16 1715 05/16/16 1128  GLUCAP 109* 120*    Imaging No results found.  STUDIES:  None  CULTURES: Pending Blood cultures 2. Urine cultures MRSA screen negative  ANTIBIOTICS: Vancomycin May 16, 2016 Zosyn 05/16/2016  SIGNIFICANT EVENTS: May 16, 2016: Admitted with acute GI bleed, hypovolemic shock, a flutter  LINES/TUBES: Peripheral IVs  DISCUSSION: 82 year old African-American  female presenting with acute GI bleed, hypovolemic versus septic shock shock, sepsis, possibly of GI source, a flutter with rates in the 150s and elevated troponins  ASSESSMENT / PLAN:  PULMONARY A: History of COPD-patient is only on albuterol at home P:   -Supplemental oxygen as needed. -Nebulized bronchodilator  CARDIOVASCULAR A:  Shock-septic versus hypovolemic. History of hypertension A flutter with elevated heart rates in the 150s-refractory to IV fluids, and beta blockers. History of sick sinus syndrome. History of congestive heart failure-no recent echo. History of first-degree AV block Elevated troponin-0.1 to 0.28 P:  -EKG prn -Diltiazem infusion and change to PO Cardizem per cardiology. -IV fluids -Hemodynamics per ICU protocol -2-D echo to evaluate left ventricular function  Done -Cardiology following  RENAL A:   AKI P:   -IV fluids -Trend creatinine -Renally dose medications. -Monitor and replace electrolytes  GASTROINTESTINAL A:   Acute GI bleed Abdominal pain-generalized but more in the left lower and upper quadrants; persistent pain Dysphagia s/p gastrostomy tube P:   -Monitor per GI. -Protonix infusion -Nothing by mouth  HEMATOLOGIC A:   Acute blood loss anemia s/p transfusion P:  -Transfused 1 unit of packed red blood cells 06/04 -Trend hemoglobin and hematocrit  INFECTIOUS A:   Sepsis of unknown source, most likely GI P:   -Broad-spectrum antibiotics. -Follow-up cultures  ENDOCRINE A:   No acute issues  P:   -Monitor blood glucose with BMPs  NEUROLOGIC A:   History of dementia. History of depression P:   RASS goal: Not applicable -Supportive care   Disposition and family update: No family at bedside this a.m. Further changes in current treatment plan pending clinical course and diagnostics.  Best Practice: Code Status: DNR Diet: NPO GI prophylaxis:  PPI. VTE prophylaxis:  SCD's / contraindicated due to GI  bleed  Xavian Hardcastle S. [facility] ANP-BC Pulmonary and Critical Care Medicine [facility] Pager [phone] or [phone]  05/17/2016, 6:58 AM

## 2016-05-17 NOTE — Progress Notes (Signed)
Pharmacy Antibiotic Note  Amanda Mcdowell is a 82 y.o. female admitted on 2016-06-06 with sepsis.  Pharmacy has been consulted for vancomycin and Zosyn dosing. Vancomycin d/c 6/4.  GI bleed. From SNF  Plan: Continue Zosyn 3.375 grams q 8 hours.   Height: 5\' 6"  (167.6 cm) Weight: 166 lb 7.2 oz (75.5 kg) IBW/kg (Calculated) : 59.3  Temp (24hrs), Avg:98.7 F (37.1 C), Min:98.7 F (37.1 C), Max:98.8 F (37.1 C)   Recent Labs Lab 06-06-2016 1306 06-06-16 1752 06-06-16 2100 06-06-2016 2325 05/16/16 0539 05/16/16 1326 05/17/16 0436  WBC 29.6*  --   --   --  27.2* 24.2* 16.5*  CREATININE 1.43*  --   --   --  1.15*  --  1.06*  LATICACIDVEN  --  5.9* 5.1* 5.1* 3.7*  --  1.9    Estimated Creatinine Clearance: 38.1 mL/min (by C-G formula based on Cr of 1.06).    Allergies  Allergen Reactions  . Atenolol Other (See Comments)    Unknown reaction.    Antimicrobials this admission: vancomycin  6/3>> 6/4 Zosyn 6/3 >>   Dose adjustments this admission:   Microbiology results: 6/3 BCx: NGTD x 2 6/3 UCx: negative 6/3 MRSA PCR: (-)   6/3 CXR: L base atelectasis 6/3 UA: LE (tr) NO2(-) WBC 0-5  Thank you for allowing pharmacy to be a part of this patient's care.  8/3 D 05/17/2016 12:55 PM

## 2016-05-17 NOTE — NC FL2 (Signed)
[place] MEDICAID FL2 LEVEL OF CARE SCREENING TOOL     IDENTIFICATION  Patient Name: Amanda Mcdowell Birthdate: [birth year] Sex: female Admission Date (Current Location): 05/28/2016  [place] and [place] Number:      Facility and Address:  [facility], [address], [place], [place] [address]      Provider Number: [id number]  Attending Physician Name and Address:  Auburn Bilberry, MD  Relative Name and Phone Number:       Current Level of Care: Hospital Recommended Level of Care: Skilled Nursing Facility Prior Approval Number:    Date Approved/Denied:   PASRR Number: [id number]  Discharge Plan: SNF    Current Diagnoses: Patient Active Problem List   Diagnosis Date Noted  . Pressure ulcer 05/16/2016  . GIB (gastrointestinal bleeding) 06/11/2016  . Anemia 05/23/2016  . Atrial flutter (HCC) 06/07/2016  . Sepsis (HCC) 05/14/2016  . ARF (acute renal failure) (HCC) 05/16/2016  . Hypotension 05/18/2016    Orientation RESPIRATION BLADDER Height & Weight        Normal, O2 Incontinent Weight: 166 lb 7.2 oz (75.5 kg) Height:  5\' 6"  (167.6 cm)  BEHAVIORAL SYMPTOMS/MOOD NEUROLOGICAL BOWEL NUTRITION STATUS   (none)  (none) Incontinent Feeding tube  AMBULATORY STATUS COMMUNICATION OF NEEDS Skin   Total Care Verbally Normal                       Personal Care Assistance Level of Assistance  Total care       Total Care Assistance: Maximum assistance   Functional Limitations Info             SPECIAL CARE FACTORS FREQUENCY                       Contractures Contractures Info: Not present    Additional Factors Info  Code Status, Allergies Code Status Info: dnr Allergies Info: atenolol           Current Medications (05/17/2016):  This is the current hospital active medication list Current Facility-Administered Medications  Medication Dose Route Frequency Provider Last Rate Last Dose  . 0.9 %  sodium chloride infusion    Intravenous Continuous 07/17/2016, MD 75 mL/hr at 05/16/16 1832    . 0.9 %  sodium chloride infusion   Intravenous Once 07/16/16, NP      . 0.9 %  sodium chloride infusion   Intravenous Once Lewie Loron, MD      . 0.9 %  sodium chloride infusion   Intravenous Once Auburn Bilberry, MD      . albuterol (PROVENTIL) (2.5 MG/3ML) 0.083% nebulizer solution 2.5 mg  2.5 mg Nebulization Q2H PRN Scot Jun, MD      . diltiazem (CARDIZEM) 100 mg in dextrose 5 % 100 mL (1 mg/mL) infusion  5-15 mg/hr Intravenous Titrated Shaune Pollack, MD 8 mL/hr at 05/17/16 0800 8 mg/hr at 05/17/16 0800  . morphine 2 MG/ML injection 1-2 mg  1-2 mg Intravenous Q2H PRN 07/17/16, NP   1 mg at 05/16/16 2023  . norepinephrine (LEVOPHED) 4mg  in D5W 2024 premix infusion  2-50 mcg/min Intravenous Titrated , NP 7.5 mL/hr at 05/17/16 0800 2 mcg/min at 05/17/16 0800  . ondansetron (ZOFRAN) injection 4 mg  4 mg Intravenous Q6H 07/17/16, MD   4 mg at 05/17/16 [id number]  . ondansetron (ZOFRAN) tablet 4 mg  4 mg Oral Q6H PRN 07/17/16, MD      .  pantoprazole (PROTONIX) 80 mg in sodium chloride 0.9 % 250 mL (0.32 mg/mL) infusion  8 mg/hr Intravenous Continuous Gayla Doss, MD 25 mL/hr at 05/17/16 1406 8 mg/hr at 05/17/16 1406  . piperacillin-tazobactam (ZOSYN) IVPB 3.375 g  3.375 g Intravenous [address] [place], RPH   3.375 g at 05/17/16 5686  . sodium chloride flush (NS) 0.9 % injection 3 mL  3 mL Intravenous Q12H Shaune Pollack, MD   3 mL at 05/16/16 2200     Discharge Medications: Please see discharge summary for a list of discharge medications.  Relevant Imaging Results:  Relevant Lab Results:   Additional Information ss: [id number]  York Spaniel, LCSW

## 2016-05-18 ENCOUNTER — Inpatient Hospital Stay: Payer: Medicare Other

## 2016-05-18 LAB — CBC WITH DIFFERENTIAL/PLATELET
BASOS ABS: 0 10*3/uL (ref 0–0.1)
Basophils Relative: 0 %
EOS ABS: 0 10*3/uL (ref 0–0.7)
HCT: 30.8 % — ABNORMAL LOW (ref 35.0–47.0)
Hemoglobin: 10 g/dL — ABNORMAL LOW (ref 12.0–16.0)
LYMPHS ABS: 0.5 10*3/uL — AB (ref 1.0–3.6)
Lymphocytes Relative: 3 %
MCH: 27.6 pg (ref 26.0–34.0)
MCHC: 32.6 g/dL (ref 32.0–36.0)
MCV: 84.7 fL (ref 80.0–100.0)
Monocytes Absolute: 0.7 10*3/uL (ref 0.2–0.9)
Monocytes Relative: 4 %
Neutro Abs: 16.3 10*3/uL — ABNORMAL HIGH (ref 1.4–6.5)
Neutrophils Relative %: 93 %
PLATELETS: 159 10*3/uL (ref 150–440)
RBC: 3.63 MIL/uL — AB (ref 3.80–5.20)
RDW: 18.8 % — AB (ref 11.5–14.5)
WBC: 17.5 10*3/uL — AB (ref 3.6–11.0)

## 2016-05-18 LAB — BASIC METABOLIC PANEL
ANION GAP: 7 (ref 5–15)
BUN: 28 mg/dL — AB (ref 6–20)
CALCIUM: 7.8 mg/dL — AB (ref 8.9–10.3)
CO2: 19 mmol/L — ABNORMAL LOW (ref 22–32)
Chloride: 120 mmol/L — ABNORMAL HIGH (ref 101–111)
Creatinine, Ser: 0.91 mg/dL (ref 0.44–1.00)
GFR calc Af Amer: >60 mL/min (ref >60)
GFR, EST NON AFRICAN AMERICAN: 55 mL/min — AB (ref >60)
Glucose, Bld: 100 mg/dL — ABNORMAL HIGH (ref 65–99)
POTASSIUM: 2.9 mmol/L — AB (ref 3.5–5.1)
SODIUM: 146 mmol/L — AB (ref 135–145)

## 2016-05-18 LAB — TYPE AND SCREEN
ABO/RH(D): O POS
Antibody Screen: NEGATIVE
UNIT DIVISION: 0
UNIT DIVISION: 0
Unit division: 0

## 2016-05-18 LAB — URINE CULTURE
Culture: NO GROWTH
Special Requests: NORMAL

## 2016-05-18 LAB — POTASSIUM
POTASSIUM: 4.6 mmol/L (ref 3.5–5.1)
Potassium: 3.1 mmol/L — ABNORMAL LOW (ref 3.5–5.1)

## 2016-05-18 LAB — PREPARE PLATELET PHERESIS: UNIT DIVISION: 0

## 2016-05-18 LAB — MAGNESIUM: MAGNESIUM: 2 mg/dL (ref 1.7–2.4)

## 2016-05-18 LAB — PHOSPHORUS: Phosphorus: 2.6 mg/dL (ref 2.5–4.6)

## 2016-05-18 MED ORDER — CHLORHEXIDINE GLUCONATE 0.12 % MT SOLN
15.0000 mL | Freq: Two times a day (BID) | OROMUCOSAL | Status: DC
  Administered 2016-05-18 – 2016-05-22 (×8): 15 mL via OROMUCOSAL
  Filled 2016-05-18 (×7): qty 15

## 2016-05-18 MED ORDER — CETYLPYRIDINIUM CHLORIDE 0.05 % MT LIQD
7.0000 mL | Freq: Two times a day (BID) | OROMUCOSAL | Status: DC
  Administered 2016-05-19 – 2016-05-22 (×7): 7 mL via OROMUCOSAL

## 2016-05-18 MED ORDER — POTASSIUM CHLORIDE 10 MEQ/100ML IV SOLN
10.0000 meq | INTRAVENOUS | Status: AC
  Administered 2016-05-18 – 2016-05-19 (×4): 10 meq via INTRAVENOUS
  Filled 2016-05-18 (×4): qty 100

## 2016-05-18 MED ORDER — SODIUM CHLORIDE 0.9 % IV SOLN
20.0000 mg | Freq: Two times a day (BID) | INTRAVENOUS | Status: DC
  Filled 2016-05-18 (×2): qty 2

## 2016-05-18 MED ORDER — POTASSIUM CHLORIDE 10 MEQ/100ML IV SOLN
10.0000 meq | INTRAVENOUS | Status: AC
  Administered 2016-05-18 (×4): 10 meq via INTRAVENOUS
  Filled 2016-05-18 (×4): qty 100

## 2016-05-18 MED ORDER — FAMOTIDINE IN NACL 20-0.9 MG/50ML-% IV SOLN
20.0000 mg | Freq: Two times a day (BID) | INTRAVENOUS | Status: DC
Start: 2016-05-18 — End: 2016-05-18
  Filled 2016-05-18: qty 50

## 2016-05-18 MED ORDER — PANTOPRAZOLE SODIUM 40 MG IV SOLR
40.0000 mg | Freq: Two times a day (BID) | INTRAVENOUS | Status: DC
  Administered 2016-05-18 – 2016-05-22 (×9): 40 mg via INTRAVENOUS
  Filled 2016-05-18 (×9): qty 40

## 2016-05-18 NOTE — Consult Note (Signed)
I was called by Radiologist, with concern of Ruptured infrarenal AAA 8x9 CM and 9 CM hematoma. I consulted Vasc Surgery STAT.  Findings discussed with Daughter    Lucie Leather, M.D.  [facility]  Medical Director [facility] Medical Director [facility] Cardio-Pulmonary Department

## 2016-05-18 NOTE — Progress Notes (Signed)
Initial Nutrition Assessment  DOCUMENTATION CODES:   Non-severe (moderate) malnutrition in context of chronic illness  INTERVENTION:  -Await CT abdomen, further poc per GI; once able to restart TF, recommend restarting continuous feedings at low rate and titrating as tolerated. Further recommendations to follow   NUTRITION DIAGNOSIS:   Inadequate oral intake related to acute illness as evidenced by NPO status.  GOAL:   Patient will meet greater than or equal to 90% of their needs  MONITOR:   Labs, I & O's, Weight trends (Ability to restart TF)  REASON FOR ASSESSMENT:   Consult Assessment of nutrition requirement/status  ASSESSMENT:   82 yo female admitted after vomiting blood with GI bleed and anemia due to acute blood loss. . Pt with hx of multiple abdominal surgeries, bowel obstruction; per chart review, pt with J-tube placed in 2013 but currently with G-tube. Pt receives TF via G-tube and nothing by mouth. Per report, pt with significant vomiting for last 1-2 weeks but has been vomiting multiple times a week for the past 6 months. Pt with severe dementia at baseline.    Abdominal xray on admission negative, CT abdomen pending. +abdominal pain at present, rectal tube in place with liquid brown stool. NPO at present. Per paper chart, pt receiving Jevity 1.5 at 45 ml/hr with 60 ml/hr free water flush per G-tube providing 1620 kcals, 69 g of protein and 2260 mL of free water. Again note that pt has been vomiting over past 6 months. Awaiting call back from Dietitian at [facility] regarding pt's nutritional prescription, tolerance, wt trend, etc.   Nutrition-Focused physical exam completed. Findings are mild fat depletion, mild/moderate muscle depletion, and mild edema.     Past Medical History  Diagnosis Date  . HTN (hypertension)   . AV block, 1st degree   . Atrial flutter (HCC)   . COPD (chronic obstructive pulmonary disease) (HCC)   . RA (rheumatoid arthritis) (HCC)    . Anemia   . Hyperlipidemia   . Heart failure (HCC)   . UTI (lower urinary tract infection)   . SSS (sick sinus syndrome) (HCC)   . Dementia   . Depression   . Appendicitis     Diet Order:  Diet NPO time specified  Skin:   (deep tissue injury to heel)  Last BM:  05/18/16 liquid stool via rectal tube   Labs: sodium 146, potassium 2.9 (supplemented), phosphorus and magnesium wdl  Meds: NS at 75 ml/hr  Height:   Ht Readings from Last 1 Encounters:  05/24/2016 5\' 6"  (1.676 m)    Weight: no recent wt encounters in system but wt stable per MD note  Wt Readings from Last 1 Encounters:  05/16/2016 166 lb 7.2 oz (75.5 kg)    BMI:  Body mass index is 26.88 kg/(m^2).  Estimated Nutritional Needs:   Kcal:  1600-1800 kcals  Protein:  70-88 g  Fluid:  >/= 1.6 L  EDUCATION NEEDS:   No education needs identified at this time  07/15/16 MS, RD, LDN [phone] Pager  [phone] Weekend/On-Call Pager

## 2016-05-18 NOTE — Progress Notes (Addendum)
[facility] [place] [facility]                                                                                                                                                                                            Patient Demographics   Amanda Mcdowell, is a 82 y.o. female, DOB - [birth year], ZWC:[id number]  Admit date - 2016-06-03   Admitting Physician Shaune Pollack, MD  Outpatient Primary MD for the patient is No primary care provider on file.   LOS - 3  Subjective:Patient's hemoglobin has drifted down. Still on low-dose Levophed continues to complain of abdominal pain.    Review of Systems:   CONSTITUTIONAL: Unable to provide due to confusion  Vitals:   Filed Vitals:   05/18/16 0300 05/18/16 0400 05/18/16 0600 05/18/16 0700  BP: 114/57 114/54 119/61 115/76  Pulse:    38  Temp:      TempSrc:      Resp: 16 17 16 16   Height:      Weight:      SpO2: 96% 92% 96% 100%    Wt Readings from Last 3 Encounters:  Jun 03, 2016 75.5 kg (166 lb 7.2 oz)     Intake/Output Summary (Last 24 hours) at 05/18/16 1225 Last data filed at 05/18/16 0700  Gross per 24 hour  Intake 4146.28 ml  Output   1300 ml  Net 2846.28 ml    Physical Exam:   GENERAL: Critically ill-appearing HEAD, EYES, EARS, NOSE AND THROAT: Atraumatic, normocephalic. Pale conjunctiva. Pupils equal and reactive to light. Sclerae anicteric.  No oro-pharyngeal erythema.  NECK: Supple. There is no jugular venous distention. No bruits, no lymphadenopathy, no thyromegaly.  HEART: Irregularly irregular. No murmurs, no rubs, no clicks.  LUNGS: Clear to auscultation bilaterally. No rales or rhonchi. No wheezes.  ABDOMEN: Soft, flat, epigastric tenderness, nondistended. Has good bowel sounds. No hepatosplenomegaly appreciated.  EXTREMITIES: No evidence of any cyanosis, clubbing, or peripheral edema.  +2 pedal and radial pulses bilaterally.  NEUROLOGIC:Currently drowsy  SKIN: Moist and warm with no  rashes appreciated.  Psych: Not anxious, depressed LN: No inguinal LN enlargement    Antibiotics   Anti-infectives    Start     Dose/Rate Route Frequency Ordered Stop   05/17/16 0500  vancomycin (VANCOCIN) IVPB 1000 mg/200 mL premix  Status:  Discontinued     1,000 mg 200 mL/hr over 60 Minutes Intravenous Every 24 hours 05/16/16 1156 05/16/16 1426   05/16/16 0300  vancomycin (VANCOCIN) 1,250 mg in sodium chloride 0.9 % 250 mL IVPB  Status:  Discontinued     1,250 mg 166.7 mL/hr over  90 Minutes Intravenous Every 36 hours 05/16/16 0048 05/16/16 1156   2016/05/29 1800  vancomycin (VANCOCIN) IVPB 1000 mg/200 mL premix     1,000 mg 200 mL/hr over 60 Minutes Intravenous  Once 2016-05-29 1717 05-29-16 2030   05-29-16 1800  piperacillin-tazobactam (ZOSYN) IVPB 3.375 g     3.375 g 12.5 mL/hr over 240 Minutes Intravenous Every 8 hours 2016/05/29 1733     05-29-16 1730  piperacillin-tazobactam (ZOSYN) IVPB 3.375 g  Status:  Discontinued     3.375 g 100 mL/hr over 30 Minutes Intravenous  Once 05/29/2016 1717 May 29, 2016 1732   29-May-2016 1630  piperacillin-tazobactam (ZOSYN) IVPB 3.375 g  Status:  Discontinued     3.375 g 100 mL/hr over 30 Minutes Intravenous  Once 2016-05-29 1559 May 29, 2016 1733   05-29-2016 1630  vancomycin (VANCOCIN) IVPB 1000 mg/200 mL premix  Status:  Discontinued     1,000 mg 200 mL/hr over 60 Minutes Intravenous  Once 05-29-16 1602 29-May-2016 1734      Medications   Scheduled Meds: . sodium chloride   Intravenous Once  . sodium chloride   Intravenous Once  . sodium chloride   Intravenous Once  . ondansetron (ZOFRAN) IV  4 mg Intravenous Q6H  . pantoprazole (PROTONIX) IV  40 mg Intravenous Q12H  . piperacillin-tazobactam (ZOSYN)  IV  3.375 g Intravenous Q8H  . sodium chloride flush  3 mL Intravenous Q12H   Continuous Infusions: . sodium chloride 75 mL/hr at 05/18/16 0625  . diltiazem (CARDIZEM) infusion 5 mg/hr (05/18/16 0722)   PRN Meds:.albuterol, morphine injection,  ondansetron **OR** [DISCONTINUED] ondansetron (ZOFRAN) IV   Data Review:   Micro Results Recent Results (from the past 240 hour(s))  Urine culture     Status: None   Collection Time: 05/29/2016  4:00 PM  Result Value Ref Range Status   Specimen Description URINE, RANDOM  Final   Special Requests NONE  Final   Culture NO GROWTH Performed at [facility]   Final   Report Status 05/16/2016 FINAL  Final  MRSA PCR Screening     Status: None   Collection Time: 2016-05-29  5:51 PM  Result Value Ref Range Status   MRSA by PCR NEGATIVE NEGATIVE Final    Comment:        The GeneXpert MRSA Assay (FDA approved for NASAL specimens only), is one component of a comprehensive MRSA colonization surveillance program. It is not intended to diagnose MRSA infection nor to guide or monitor treatment for MRSA infections.   Culture, blood (x 2)     Status: None (Preliminary result)   Collection Time: 05/29/16  5:52 PM  Result Value Ref Range Status   Specimen Description BLOOD LEFT AC  Final   Special Requests   Final    BOTTLES DRAWN AEROBIC AND ANAEROBIC  ANA , AER 2 ML   Culture NO GROWTH 2 DAYS  Final   Report Status PENDING  Incomplete  Culture, blood (x 2)     Status: None (Preliminary result)   Collection Time: May 29, 2016  7:17 PM  Result Value Ref Range Status   Specimen Description BLOOD LEFT WRIST  Final   Special Requests   Final    BOTTLES DRAWN AEROBIC AND ANAEROBIC AEROBIC 15CC, ANAEROBIC 10CC   Culture NO GROWTH 2 DAYS  Final   Report Status PENDING  Incomplete  Urine culture     Status: None   Collection Time: 2016/05/29 10:06 PM  Result Value Ref Range Status   Specimen Description  URINE, CLEAN CATCH  Final   Special Requests Normal  Final   Culture NO GROWTH Performed at [facility]   Final   Report Status 05/18/2016 FINAL  Final    Radiology Reports Dg Abd 1 View  05/16/2016  CLINICAL DATA:  Abdominal pain EXAM: ABDOMEN - 1 VIEW COMPARISON:  06/08/2016  FINDINGS: Gastrostomy tube projects over the left abdomen. Nonobstructive bowel gas pattern. No free air organomegaly. No acute bony abnormality. Bilateral hip replacements noted. IMPRESSION: No acute findings. Electronically Signed   By: Charlett Nose M.D.   On: 05/16/2016 07:21   Dg Abd 1 View  06/05/2016  CLINICAL DATA:  Pain, vomiting. EXAM: ABDOMEN - 1 VIEW COMPARISON:  None FINDINGS: Nonspecific bowel gas pattern. Mild dilatation of bowel in the right pelvis which is likely the cecum filled with stool. No convincing evidence for bowel obstruction. No organomegaly or free air. Gastrostomy tube projects over the stomach. Visualized lung bases are clear. IMPRESSION: Nonspecific bowel gas pattern without evidence for obstruction. Large stool burden in the right colon. Electronically Signed   By: Charlett Nose M.D.   On: 05/24/2016 13:53   Dg Chest Portable 1 View  06/04/2016  CLINICAL DATA:  Pain, vomiting. EXAM: PORTABLE CHEST 1 VIEW COMPARISON:  None. FINDINGS: Mild cardiomegaly. Minimal left base atelectasis. Right lung is clear. No effusions. Heart is normal size. IMPRESSION: Left base atelectasis.  Mild cardiomegaly. Electronically Signed   By: Charlett Nose M.D.   On: 05/20/2016 13:53     CBC  Recent Labs Lab 06/03/2016 1306  06/11/2016 2325 05/16/16 0539 05/16/16 1326 05/17/16 0436 05/18/16 0504  WBC 29.6*  --   --  27.2* 24.2* 16.5* 17.5*  HGB 10.3*  < > 8.5* 8.2* 9.2* 8.0* 10.0*  HCT 32.4*  --   --  25.3* 28.5* 25.1* 30.8*  PLT 251  --   --  172 160 152 159  MCV 87.0  --   --  88.0 87.9 87.9 84.7  MCH 27.7  --   --  28.6 28.3 28.1 27.6  MCHC 31.8*  --   --  32.5 32.2 32.0 32.6  RDW 18.3*  --   --  17.7* 17.6* 17.8* 18.8*  LYMPHSABS 0.8*  --   --  0.7*  --  0.3* 0.5*  MONOABS 1.4*  --   --  0.7  --  0.6 0.7  EOSABS 0.0  --   --  0.0  --  0.0 0.0  BASOSABS 0.1  --   --  0.0  --  0.0 0.0  < > = values in this interval not displayed.  Chemistries   Recent Labs Lab 05/21/2016 1306  06/10/2016 1752 05/16/16 0539 05/17/16 0436 05/18/16 0504  NA 135  --  138 144 146*  K 4.2  --  4.0 3.7 2.9*  CL 96*  --  111 119* 120*  CO2 23  --  20* 19* 19*  GLUCOSE 171*  --  129* 134* 100*  BUN 53*  --  48* 37* 28*  CREATININE 1.43*  --  1.15* 1.06* 0.91  CALCIUM 9.0  --  7.1* 7.1* 7.8*  MG  --  2.4 2.1 2.1 2.0  AST 30  --  32  --   --   ALT 17  --  16  --   --   ALKPHOS 74  --  54  --   --   BILITOT 0.8  --  1.2  --   --    ------------------------------------------------------------------------------------------------------------------  estimated creatinine clearance is 44.4 mL/min (by C-G formula based on Cr of 0.91). ------------------------------------------------------------------------------------------------------------------ No results for input(s): HGBA1C in the last 72 hours. ------------------------------------------------------------------------------------------------------------------ No results for input(s): CHOL, HDL, LDLCALC, TRIG, CHOLHDL, LDLDIRECT in the last 72 hours. ------------------------------------------------------------------------------------------------------------------ No results for input(s): TSH, T4TOTAL, T3FREE, THYROIDAB in the last 72 hours.  Invalid input(s): FREET3 ------------------------------------------------------------------------------------------------------------------ No results for input(s): VITAMINB12, FOLATE, FERRITIN, TIBC, IRON, RETICCTPCT in the last 72 hours.  Coagulation profile  Recent Labs Lab 06/09/2016 1306 05/16/16 0539  INR 1.16 1.23    No results for input(s): DDIMER in the last 72 hours.  Cardiac Enzymes  Recent Labs Lab 05/20/2016 1752 06/02/2016 2325 05/16/16 0539  TROPONINI 0.29* 0.28* 0.19*   ------------------------------------------------------------------------------------------------------------------ Invalid input(s): POCBNP    Assessment & Plan   Patient is a 82 year old  African-American female presenting with upper GI bleed  1. UPPER GI bleeding and anemia due to acute blood loss Continue IV Protonix drip Hemoglobins Stable today Status post transfusion   2. A flutter with RVR,  Continue IV Cardizem for now Appreciate cardiology input  3. Sepsis and hypotension Obtained CT of the abdomen Continue empiric Zosyn and vancomycin  4. Acute renal failure  improved  5. Elevated troponin due to demand ischemia  6. Severe hypokalemia IV KCl  7. Code DO NOT RESUSCITATE     Code Status Orders        Start     Ordered   05/14/2016 1718  Do not attempt resuscitation (DNR)   Continuous    Question Answer Comment  In the event of cardiac or respiratory ARREST Do not call a "code blue"   In the event of cardiac or respiratory ARREST Do not perform Intubation, CPR, defibrillation or ACLS   In the event of cardiac or respiratory ARREST Use medication by any route, position, wound care, and other measures to relive pain and suffering. May use oxygen, suction and manual treatment of airway obstruction as needed for comfort.      05/25/2016 1717    Code Status History    Date Active Date Inactive Code Status Order ID Comments User Context   This patient has a current code status but no historical code status.    Advance Directive Documentation        Most Recent Value   Type of Advance Directive  Out of facility DNR (pink MOST or yellow form)   Pre-existing out of facility DNR order (yellow form or pink MOST form)  Yellow form placed in chart (order not valid for inpatient use)   "MOST" Form in Place?             Consults GI, CArdiology  DVT ProphylaxisSCDs  Lab Results  Component Value Date   PLT 159 05/18/2016     Time Spent in minutes  time spent  Greater than 50% of time spent in care coordination and counseling patient regarding the condition and plan of care.   Auburn Bilberry M.D on 05/18/2016 at 12:25 PM  Between 7am to 6pm  - Pager - [phone]  After 6pm go to [email or web address] - password [email or web address] [facility]  [facility]   Office  [phone]

## 2016-05-18 NOTE — Care Management (Addendum)
Admitted to icu stepdown for hematemesis and sepsis. Presented from [facility].  Required infusion of FFP and packed cells and on low dose levophed to maintain blood pressure and protonix drip.  Continue to trend hemoglobins.  she has long term Peg tube.  Palliative care consult is pending.  At present, anticipate return to [facility] and CSW is involved.  Floor care orders present

## 2016-05-18 NOTE — Progress Notes (Signed)
Pharmacy Consult for Electrolyte Monitoring Indication: Hypokalemia  Allergies  Allergen Reactions  . Atenolol Other (See Comments)    Unknown reaction.    Patient Measurements: Height: 5\' 6"  (167.6 cm) Weight: 166 lb 7.2 oz (75.5 kg) IBW/kg (Calculated) : 59.3  Vital Signs: Temp: 97.9 F (36.6 C) (06/06 0230) Temp Source: Axillary (06/06 0230) BP: 115/76 mmHg (06/06 0700) Pulse Rate: 38 (06/06 0700) Intake/Output from previous day: 06/05 0701 - 06/06 0700 In: 4221.3 [I.V.:2951.3; Blood:970; IV Piggyback:300] Out: 1300 [Urine:1300] Intake/Output from this shift:    Labs:  Recent Labs  05/16/16 0539 05/16/16 1326 05/17/16 0436 05/18/16 0504  WBC 27.2* 24.2* 16.5* 17.5*  HGB 8.2* 9.2* 8.0* 10.0*  HCT 25.3* 28.5* 25.1* 30.8*  PLT 172 160 152 159  INR 1.23  --   --   --      Recent Labs  05/16/16 0539 05/17/16 0436 05/18/16 0504 05/18/16 1258  NA 138 144 146*  --   K 4.0 3.7 2.9* 4.6  CL 111 119* 120*  --   CO2 20* 19* 19*  --   GLUCOSE 129* 134* 100*  --   BUN 48* 37* 28*  --   CREATININE 1.15* 1.06* 0.91  --   CALCIUM 7.1* 7.1* 7.8*  --   MG 2.1 2.1 2.0  --   PHOS 3.2 3.2 2.6  --   PROT 4.8*  --   --   --   ALBUMIN 2.1*  --   --   --   AST 32  --   --   --   ALT 16  --   --   --   ALKPHOS 54  --   --   --   BILITOT 1.2  --   --   --   BILIDIR 0.3  --   --   --   IBILI 0.9  --   --   --    Estimated Creatinine Clearance: 44.4 mL/min (by C-G formula based on Cr of 0.91).    Recent Labs  05/20/2016 1715 05/16/16 1128  GLUCAP 109* 120*    Medical History: Past Medical History  Diagnosis Date  . HTN (hypertension)   . AV block, 1st degree   . Atrial flutter (HCC)   . COPD (chronic obstructive pulmonary disease) (HCC)   . RA (rheumatoid arthritis) (HCC)   . Anemia   . Hyperlipidemia   . Heart failure (HCC)   . UTI (lower urinary tract infection)   . SSS (sick sinus syndrome) (HCC)   . Dementia   . Depression   . Appendicitis      Medications:  Scheduled:  . sodium chloride   Intravenous Once  . sodium chloride   Intravenous Once  . sodium chloride   Intravenous Once  . ondansetron (ZOFRAN) IV  4 mg Intravenous Q6H  . pantoprazole (PROTONIX) IV  40 mg Intravenous Q12H  . piperacillin-tazobactam (ZOSYN)  IV  3.375 g Intravenous Q8H  . sodium chloride flush  3 mL Intravenous Q12H   Infusions:  . sodium chloride 75 mL/hr at 05/18/16 0625  . diltiazem (CARDIZEM) infusion 5 mg/hr (05/18/16 07/18/16)    Assessment: Pharmacy consulted to assist in managing and replacing electrolytes in this 82 y/o F admitted with GIB.   Plan:  Potassium replaced with KCl 10 meq iv x 4 runs this AM. Repeat level is higher than predicted at 4.6. Will repeat potassium level at 1800 to confirm and f/u am labs.  Luisa Hart D 05/18/2016,1:58 PM

## 2016-05-18 NOTE — Consult Note (Signed)
[facility] Vascular Consult Note  MRN : [id number]  Amanda Mcdowell is a 82 y.o. (May 21, 1926) female who presents with chief complaint of  Chief Complaint  Patient presents with  . Hematemesis  .  History of Present Illness: I am asked to evaluate the patient by Dr. Belia Heman for ruptured abdominal aortic aneurysm. The patient is a 82 y.o. female with a known history of Hypertension, a flutter, COPD, UTI and heart failure who presented several days ago with the above-noted complaint associated with abdominal pain and hypotension. The patient was sent from nursing home to the ED due to hematemesis today.  According to the patient's daughter, patient was found to have vomiting blood in nursing home and sent to ED for further evaluation. In the emergency room she was noted to be in atrial fib/flutter for which she received intravenous Cardizem. She did not tolerate this with a drop in her systolic pressure in the 70s. Subsequently she was transferred to the ICU. Over the past several days she has undergone resuscitation her anemia has been improved with transfusions her heart rate has been stabilized and her vital signs overall have been improved and stable. Her abdominal pain has persisted and today a CT scan was obtained which demonstrates an 8.9 cm infrarenal abdominal aortic aneurysm with a large hematoma in the left lower quadrant. This is suspicious for rupture. Also of concern reviewing the medical record notes a CT scan from 2016 which documents a 4.0 cm infrarenal abdominal aortic aneurysm. This was from an outside source so the images are not available for review. However, this does represent a more than doubling of the size of the aneurysm in one year.  Current Facility-Administered Medications  Medication Dose Route Frequency Provider Last Rate Last Dose  . 0.9 %  sodium chloride infusion   Intravenous Continuous Shaune Pollack, MD 75 mL/hr at 05/18/16 0700    . 0.9 %  sodium  chloride infusion   Intravenous Once Lewie Loron, NP      . 0.9 %  sodium chloride infusion   Intravenous Once Auburn Bilberry, MD      . 0.9 %  sodium chloride infusion   Intravenous Once Scot Jun, MD      . albuterol (PROVENTIL) (2.5 MG/3ML) 0.083% nebulizer solution 2.5 mg  2.5 mg Nebulization Q2H PRN Shaune Pollack, MD      . diltiazem (CARDIZEM) 100 mg in dextrose 5 % 100 mL (1 mg/mL) infusion  5-15 mg/hr Intravenous Titrated Kalman Shan, MD 5 mL/hr at 05/18/16 0722 5 mg/hr at 05/18/16 0722  . morphine 2 MG/ML injection 1-2 mg  1-2 mg Intravenous Q2H PRN Lewie Loron, NP   1 mg at 05/16/16 2023  . ondansetron (ZOFRAN) injection 4 mg  4 mg Intravenous Q6H Scot Jun, MD   4 mg at 05/18/16 1302  . ondansetron (ZOFRAN) tablet 4 mg  4 mg Oral Q6H PRN Shaune Pollack, MD      . pantoprazole (PROTONIX) injection 40 mg  40 mg Intravenous Q12H Auburn Bilberry, MD   40 mg at 05/18/16 0918  . piperacillin-tazobactam (ZOSYN) IVPB 3.375 g  3.375 g Intravenous [address] [place], RPH   3.375 g at 05/18/16 1303  . sodium chloride flush (NS) 0.9 % injection 3 mL  3 mL Intravenous Q12H Shaune Pollack, MD   3 mL at 05/18/16 7322    Past Medical History  Diagnosis Date  . HTN (hypertension)   . AV  block, 1st degree   . Atrial flutter (HCC)   . COPD (chronic obstructive pulmonary disease) (HCC)   . RA (rheumatoid arthritis) (HCC)   . Anemia   . Hyperlipidemia   . Heart failure (HCC)   . UTI (lower urinary tract infection)   . SSS (sick sinus syndrome) (HCC)   . Dementia   . Depression   . Appendicitis     Past Surgical History  Procedure Laterality Date  . Peg placement    . Bowel obstruction surgery      Social History Social History  Substance Use Topics  . Smoking status: Never Smoker   . Smokeless tobacco: None  . Alcohol Use: No    Family History Family History  Problem Relation Age of Onset  . Family history unknown: Yes  No family history of bleeding clotting  disorders, porphyria or autoimmune disease  Allergies  Allergen Reactions  . Atenolol Other (See Comments)    Unknown reaction.     REVIEW OF SYSTEMS (Negative unless checked)  Constitutional: [x] Weight loss  [] Fever  [] Chills Cardiac: [] Chest pain   [] Chest pressure   [] Palpitations   [] Shortness of breath when laying flat   [x] Shortness of breath at rest   [] Shortness of breath with exertion. Vascular:  [] Pain in legs with walking   [] Pain in legs at rest   [] Pain in legs when laying flat   [] Claudication   [] Pain in feet when walking  [] Pain in feet at rest  [] Pain in feet when laying flat   [] History of DVT   [] Phlebitis   [x] Swelling in legs   [] Varicose veins   [x] Non-healing ulcers Pulmonary:   [] Uses home oxygen   [] Productive cough   [] Hemoptysis   [] Wheeze  [] COPD   [] Asthma Neurologic:  [] Dizziness  [] Blackouts   [] Seizures   [] History of stroke   [] History of TIA  [] Aphasia   [] Temporary blindness   [] Dysphagia   [] Weakness or numbness in arms   [] Weakness or numbness in legs Musculoskeletal:  [] Arthritis   [] Joint swelling   [] Joint pain   [] Low back pain Hematologic:  [] Easy bruising  [] Easy bleeding   [] Hypercoagulable state   [] Anemic  [] Hepatitis Gastrointestinal:  [] Blood in stool   [] Vomiting blood  [] Gastroesophageal reflux/heartburn   [] Difficulty swallowing. Genitourinary:  [] Chronic kidney disease   [] Difficult urination  [] Frequent urination  [] Burning with urination   [] Blood in urine Skin:  [] Rashes   [x] Ulcers   [] Wounds Psychological:  [] History of anxiety   []  History of major depression.    Physical Examination  Filed Vitals:   05/18/16 1500 05/18/16 1600 05/18/16 1700 05/18/16 1800  BP: 102/71 116/81 122/78 128/80  Pulse: 70 67 96 127  Temp:      TempSrc:      Resp: 17 16 16 19   Height:      Weight:      SpO2: 100% 100% 100% 100%   Body mass index is 26.88 kg/(m^2).  Head: [place]/AT, Moderate temporalis wasting. Prominent temp pulse not  noted. Ear/Nose/Throat: Nares w/o erythema or drainage, oropharynx w/o obsrtuction, Mallampati score: Class III.  Dentition poor.  Eyes: PERRLA, Sclera nonicteric.  Neck: Supple, no nuchal rigidity.  No bruit or JVD.  Pulmonary:  Breath sounds equal bilaterally, no use of accessory muscles.  Cardiac: RRR, normal S1, S2, no Murmurs, rubs or gallops. Vascular: Patient was unable to tolerate palpation of the femoral pulses secondary to abdominal distention and tenderness. Popliteal pulses are nonpalpable pedal pulses are  nonpalpable bilaterally Gastrointestinal:  tender, distended given these findings her aneurysm is not palpable.  Musculoskeletal: Moves all extremities.  No deformity or atrophy. No edema. Neurologic: CN 2-12 intact. Symmetrical.  Speech is fluent.  Psychiatric: Judgment intact, Mood & affect appropriate for pt's clinical situation. Dermatologic: No rashes noted, multiple ulcers noted.  No cellulitis. Lymph : No Cervical,  or Inguinal lymphadenopathy.      CBC Lab Results  Component Value Date   WBC 17.5* 05/18/2016   HGB 10.0* 05/18/2016   HCT 30.8* 05/18/2016   MCV 84.7 05/18/2016   PLT 159 05/18/2016    BMET    Component Value Date/Time   NA 146* 05/18/2016 0504   NA 132* 03/28/2015 1130   K 4.6 05/18/2016 1258   K 3.9 03/28/2015 1130   CL 120* 05/18/2016 0504   CL 100* 03/28/2015 1130   CO2 19* 05/18/2016 0504   CO2 25 03/28/2015 1130   GLUCOSE 100* 05/18/2016 0504   GLUCOSE 137* 03/28/2015 1130   BUN 28* 05/18/2016 0504   BUN 17 03/28/2015 1130   CREATININE 0.91 05/18/2016 0504   CREATININE 0.77 03/28/2015 1130   CALCIUM 7.8* 05/18/2016 0504   CALCIUM 9.2 03/28/2015 1130   GFRNONAA 55* 05/18/2016 0504   GFRNONAA >60 03/28/2015 1130   GFRNONAA 42* 01/20/2015 0745   GFRAA >60 05/18/2016 0504   GFRAA >60 03/28/2015 1130   GFRAA 51* 01/20/2015 0745   Estimated Creatinine Clearance: 44.4 mL/min (by C-G formula based on Cr of 0.91).  COAG Lab  Results  Component Value Date   INR 1.23 05/16/2016   INR 1.16 05/23/2016    Radiology CT scan done today shows a 8.9 cm abdominal aortic aneurysm which is likely ruptured and associated with a 9 cm hematoma in the left lower quadrant  Assessment/Plan 1. Ruptured abdominal aortic aneurysm, I have had a lengthy discussion with the patient and her daughter. The discussion lasted 70 minutes. The treatment options for repair of the abdominal aortic aneurysm endovascularly versus palliative care were discussed in great detail. After multiple questions and weighing the information she was given the daughter wishes for [place] to proceed. Patient is in agreement. We will plan for stent graft placement tomorrow. The risks and benefits as well as alternative therapies were discussed in detail as described above.  2. Atrial flutter with RVR,  Continue IV Cardizem for now Appreciate cardiology input  3. Upper GI bleeding and anemia due to acute blood loss Continue IV Protonix drip Hemoglobins Stable today Status post transfusion  4. Acute renal failure improved  5. Elevated troponin due to demand ischemia   Schnier, Latina Craver, MD  05/18/2016 6:27 PM

## 2016-05-18 NOTE — Progress Notes (Addendum)
Pharmacy Consult for Electrolyte Monitoring Indication: Hypokalemia  Allergies  Allergen Reactions  . Atenolol Other (See Comments)    Unknown reaction.   Patient Measurements: Height: 5\' 6"  (167.6 cm) Weight: 166 lb 7.2 oz (75.5 kg) IBW/kg (Calculated) : 59.3  Vital Signs: Temp: 98.6 F (37 C) (06/06 1400) Temp Source: Oral (06/06 1400) BP: 128/80 mmHg (06/06 1800) Pulse Rate: 127 (06/06 1800) Intake/Output from previous day: 06/05 0701 - 06/06 0700 In: 4221.3 [I.V.:2951.3; Blood:970; IV Piggyback:300] Out: 1300 [Urine:1300] Intake/Output from this shift:    Labs:  Recent Labs  05/16/16 0539 05/16/16 1326 05/17/16 0436 05/18/16 0504  WBC 27.2* 24.2* 16.5* 17.5*  HGB 8.2* 9.2* 8.0* 10.0*  HCT 25.3* 28.5* 25.1* 30.8*  PLT 172 160 152 159  INR 1.23  --   --   --      Recent Labs  05/16/16 0539 05/17/16 0436 05/18/16 0504 05/18/16 1258 05/18/16 1859  NA 138 144 146*  --   --   K 4.0 3.7 2.9* 4.6 3.1*  CL 111 119* 120*  --   --   CO2 20* 19* 19*  --   --   GLUCOSE 129* 134* 100*  --   --   BUN 48* 37* 28*  --   --   CREATININE 1.15* 1.06* 0.91  --   --   CALCIUM 7.1* 7.1* 7.8*  --   --   MG 2.1 2.1 2.0  --   --   PHOS 3.2 3.2 2.6  --   --   PROT 4.8*  --   --   --   --   ALBUMIN 2.1*  --   --   --   --   AST 32  --   --   --   --   ALT 16  --   --   --   --   ALKPHOS 54  --   --   --   --   BILITOT 1.2  --   --   --   --   BILIDIR 0.3  --   --   --   --   IBILI 0.9  --   --   --   --    Estimated Creatinine Clearance: 44.4 mL/min (by C-G formula based on Cr of 0.91).    Recent Labs  05/16/16 1128  GLUCAP 120*    Medical History: Past Medical History  Diagnosis Date  . HTN (hypertension)   . AV block, 1st degree   . Atrial flutter (HCC)   . COPD (chronic obstructive pulmonary disease) (HCC)   . RA (rheumatoid arthritis) (HCC)   . Anemia   . Hyperlipidemia   . Heart failure (HCC)   . UTI (lower urinary tract infection)   . SSS (sick  sinus syndrome) (HCC)   . Dementia   . Depression   . Appendicitis     Medications:  Scheduled:  . sodium chloride   Intravenous Once  . sodium chloride   Intravenous Once  . sodium chloride   Intravenous Once  . [START ON 05/30/16] antiseptic oral rinse  7 mL Mouth Rinse q12n4p  . chlorhexidine  15 mL Mouth Rinse BID  . ondansetron (ZOFRAN) IV  4 mg Intravenous Q6H  . pantoprazole (PROTONIX) IV  40 mg Intravenous Q12H  . piperacillin-tazobactam (ZOSYN)  IV  3.375 g Intravenous Q8H  . potassium chloride  10 mEq Intravenous Q1 Hr x 4  . sodium chloride flush  3 mL Intravenous Q12H   Infusions:  . sodium chloride 75 mL/hr at 05/18/16 0700  . diltiazem (CARDIZEM) infusion 5 mg/hr (05/18/16 4825)    Assessment: Pharmacy consulted to assist in managing and replacing electrolytes in this 82 y/o F admitted with GIB.   Follow up K of 3.1 is low  Plan:  Will order KCl 10 mEq IV x 4 runs  K/Mg/Phos to be checked with AM labs tomorrow  Cindi Carbon, PharmD Clinical Pharmacist 05/18/2016,7:56 PM  [phone] K 3.7 after 80 mEq IV yesterday, Mg 1.8, phos 2.2. Ordered potassium phosphate 18 mmol IV x 1, will recheck electrolytes this afternoon. Vennessa Affinito A. [place], [place].D., BCPS, Clinical Pharmacist

## 2016-05-18 NOTE — Progress Notes (Signed)
Pharmacy Antibiotic Note  Amanda Mcdowell is a 82 y.o. female admitted on 05-31-2016 with sepsis.  Pharmacy has been consulted for vancomycin and Zosyn dosing. Vancomycin d/c 6/4.  GI bleed. From SNF  Plan: Continue Zosyn 3.375 grams q 8 hours.   Height: 5\' 6"  (167.6 cm) Weight: 166 lb 7.2 oz (75.5 kg) IBW/kg (Calculated) : 59.3  Temp (24hrs), Avg:98.3 F (36.8 C), Min:97.9 F (36.6 C), Max:98.9 F (37.2 C)   Recent Labs Lab 05-31-16 1306 31-May-2016 1752 05-31-16 2100 05/31/2016 2325 05/16/16 0539 05/16/16 1326 05/17/16 0436 05/18/16 0504  WBC 29.6*  --   --   --  27.2* 24.2* 16.5* 17.5*  CREATININE 1.43*  --   --   --  1.15*  --  1.06* 0.91  LATICACIDVEN  --  5.9* 5.1* 5.1* 3.7*  --  1.9  --     Estimated Creatinine Clearance: 44.4 mL/min (by C-G formula based on Cr of 0.91).    Allergies  Allergen Reactions  . Atenolol Other (See Comments)    Unknown reaction.    Antimicrobials this admission: vancomycin  6/3>> 6/4 Zosyn 6/3 >>   Dose adjustments this admission:   Microbiology results: 6/3 BCx: NGTD x 2 6/3 UCx: negative 6/3 MRSA PCR: (-)   6/3 CXR: L base atelectasis 6/3 UA: LE (tr) NO2(-) WBC 0-5  Thank you for allowing pharmacy to be a part of this patient's care.  8/3 D 05/18/2016 12:20 PM

## 2016-05-18 NOTE — Progress Notes (Signed)
PULMONARY / CRITICAL CARE MEDICINE   Name: Amanda Mcdowell MRN: [id number] DOB: 1926/04/02    ADMISSION DATE:  06-02-2016   CONSULTATION DATE:  June 02, 2016  REFERRING MD:  Hospitalist  CHIEF COMPLAINT: Hypotension, GIB and sepsis  HISTORY OF PRESENT ILLNESS:   This is an 82 yo AA female, SNF resident with a PMH of hypertension, COPD, Atrial flutter, dementia, anemia, CHF, depression and frequent UTIs who presented with hematemesis and bloody stools. History is obtained from ED records as patient has dementia and unable to relay the events that led to this hospitalization. Apparently patient was found vomiting blood at the nursing home where she lives. At the ED, patient's heart rate was in the 150s and her rhythm was A. fib flutter. She does have baseline history of a flutter she was started on a Cardizem infusion, but her blood pressure dropped so difficult infusion was stopped. She was given 2.5 L of fluids, but she remained tachycardic and mildly hypotensive. PCCM was consulted for further management. She continues to have tarry stools, but no hematemesis.   SUBJECTIVE: not a candidate for colonoscopy due to high risk for death.  Still with some blood with lavage yesterday. Off levophed overnight.   VITAL SIGNS: BP 115/76 mmHg  Pulse 38  Temp(Src) 97.9 F (36.6 C) (Axillary)  Resp 16  Ht 5\' 6"  (1.676 m)  Wt 166 lb 7.2 oz (75.5 kg)  BMI 26.88 kg/m2  SpO2 100%  HEMODYNAMICS:    VENTILATOR SETTINGS:    INTAKE / OUTPUT: I/O last 3 completed shifts: In: 5676.8 [I.V.:4256.8; Blood:970; IV Piggyback:450] Out: 1850 [Urine:1850]  PHYSICAL EXAMINATION: General: Chronically ill-looking Neuro: speech is slow, moves all extremities HEENT: PERRLA, conjunctivae pale, trachea midline Cardiovascular:  S1, S2 audible. No murmur, regurg or gallop Lungs:  Bilateral airflow with diminished breath sounds bilaterally. No rhonchi or wheezes Abdomen:  Nondistended, positive bowel sounds, pain with  gentle palpation, worse in left upper and lower quadrants Musculoskeletal: Positive range of motion, mild contractures. Extremities: +2 pulses, trace edema Skin: Warm and dry  LABS:  BMET  Recent Labs Lab 05/16/16 0539 05/17/16 0436 05/18/16 0504  NA 138 144 146*  K 4.0 3.7 2.9*  CL 111 119* 120*  CO2 20* 19* 19*  BUN 48* 37* 28*  CREATININE 1.15* 1.06* 0.91  GLUCOSE 129* 134* 100*    Electrolytes  Recent Labs Lab 05/16/16 0539 05/17/16 0436 05/18/16 0504  CALCIUM 7.1* 7.1* 7.8*  MG 2.1 2.1 2.0  PHOS 3.2 3.2 2.6    CBC  Recent Labs Lab 05/16/16 1326 05/17/16 0436 05/18/16 0504  WBC 24.2* 16.5* 17.5*  HGB 9.2* 8.0* 10.0*  HCT 28.5* 25.1* 30.8*  PLT 160 152 159    Coag's  Recent Labs Lab 2016-06-02 1306 05/16/16 0539  APTT 25  --   INR 1.16 1.23    Sepsis Markers  Recent Labs Lab 2016/06/02 1752  2016-06-02 2325 05/16/16 0539 05/17/16 0436  LATICACIDVEN 5.9*  < > 5.1* 3.7* 1.9  PROCALCITON 2.71  --   --   --   --   < > = values in this interval not displayed.  ABG No results for input(s): PHART, PCO2ART, PO2ART in the last 168 hours.  Liver Enzymes  Recent Labs Lab 2016-06-02 1306 05/16/16 0539  AST 30 32  ALT 17 16  ALKPHOS 74 54  BILITOT 0.8 1.2  ALBUMIN 2.9* 2.1*    Cardiac Enzymes  Recent Labs Lab 02-Jun-2016 1752 2016-06-02 2325 05/16/16 0539  TROPONINI 0.29*  0.28* 0.19*    Glucose  Recent Labs Lab 2016/05/27 1715 05/16/16 1128  GLUCAP 109* 120*    Imaging No results found.  STUDIES:  None  CULTURES: Pending Blood cultures 2. Urine cultures MRSA screen negative  ANTIBIOTICS: Vancomycin 2016-05-27 Zosyn May 27, 2016  SIGNIFICANT EVENTS: 27-May-2016: Admitted with acute GI bleed, hypovolemic shock, a flutter  LINES/TUBES: Peripheral IVs  DISCUSSION: 82 year old African-American female presenting with acute GI bleed, hypovolemic versus septic shock shock, sepsis, possibly of GI source, a flutter with rates  in the 150s and elevated troponins  ASSESSMENT / PLAN:  PULMONARY A: History of COPD-patient is only on albuterol at home P:   -Supplemental oxygen as needed. -Nebulized bronchodilator  CARDIOVASCULAR A:  Shock-septic versus hypovolemic.- resolving - off levophed History of hypertension A flutter with elevated heart rates in the 150s-refractory to IV fluids, and beta blockers. History of sick sinus syndrome. History of congestive heart failure-no recent echo. History of first-degree AV block Elevated troponin-0.1 to 0.28 P:  -EKG prn -Diltiazem infusion and change to PO Cardizem per cardiology. -IV fluids -Hemodynamics per ICU protocol -2-D echo to evaluate left ventricular function  Done -Cardiology following  RENAL A:   AKI P:   -IV fluids -Trend creatinine -Renally dose medications. -Monitor and replace electrolytes  GASTROINTESTINAL A:   Acute GI bleed Abdominal pain-generalized but more in the left lower and upper quadrants; persistent pain Dysphagia s/p gastrostomy tube P:   -Monitor per GI. -Protonix infusion -Nothing by mouth  HEMATOLOGIC A:   Acute blood loss anemia s/p transfusion P:  -Transfuse PRBC for HB<7 or per protocol -Trend hemoglobin and hematocrit  INFECTIOUS A:   Sepsis of unknown source, most likely GI P:   -Broad-spectrum antibiotics. -Follow-up cultures  ENDOCRINE A:   No acute issues  P:   -Monitor blood glucose with BMPs  NEUROLOGIC A:   History of dementia. History of depression P:   RASS goal: Not applicable -Supportive care  Social  - had with poor prognosis for intervention with GIB, now off levophed, DNR/DNI, no further critical care needs at this time.  - GI following along, not a candidate for colonoscopy, might tolerate endoscopy  - consider palliative care to address goals of care and medical treatment plan.    Thank you for consulting [facility], we will signoff at this time.   Please feel free to contact us with any questions at [phone] (please enter 7-digits).   I have personally obtained a history, examined the patient, evaluated laboratory and imaging results, formulated the assessment and plan and placed orders.  Critical Care Time devoted to patient care services described in this note is 35 minutes.    Stephanie Acre, MD [facility] Pager [phone] (please enter 7-digits) On Call Pager - [phone] (please enter 7-digits)     05/18/2016, 12:23 PM

## 2016-05-18 NOTE — Consult Note (Signed)
Hgb up to 10, plt ct 159, WBC 17.5,  BP 11576 P 86,  Off levophed.  Lips pinker and eyes also.  No further bleeding.  Likely gastric bleeding has stopped for now.  Will follow with you.  No endoscopic plans at this time. Chest clear upper fields, bowel sounds present, abd distended, bowel sounds present.

## 2016-05-19 ENCOUNTER — Inpatient Hospital Stay: Payer: Medicare Other | Admitting: Certified Registered Nurse Anesthetist

## 2016-05-19 ENCOUNTER — Encounter: Admission: EM | Disposition: E | Payer: Self-pay | Source: Home / Self Care | Attending: Internal Medicine

## 2016-05-19 ENCOUNTER — Encounter: Payer: Self-pay | Admitting: Anesthesiology

## 2016-05-19 DIAGNOSIS — E440 Moderate protein-calorie malnutrition: Secondary | ICD-10-CM

## 2016-05-19 HISTORY — PX: PERIPHERAL VASCULAR CATHETERIZATION: SHX172C

## 2016-05-19 LAB — BASIC METABOLIC PANEL WITH GFR
Anion gap: 7 (ref 5–15)
BUN: 25 mg/dL — ABNORMAL HIGH (ref 6–20)
CO2: 18 mmol/L — ABNORMAL LOW (ref 22–32)
Calcium: 7.9 mg/dL — ABNORMAL LOW (ref 8.9–10.3)
Chloride: 122 mmol/L — ABNORMAL HIGH (ref 101–111)
Creatinine, Ser: 0.86 mg/dL (ref 0.44–1.00)
GFR calc Af Amer: >60 mL/min
GFR calc non Af Amer: 59 mL/min — ABNORMAL LOW
Glucose, Bld: 75 mg/dL (ref 65–99)
Potassium: 3.7 mmol/L (ref 3.5–5.1)
Sodium: 147 mmol/L — ABNORMAL HIGH (ref 135–145)

## 2016-05-19 LAB — CBC WITH DIFFERENTIAL/PLATELET
BASOS ABS: 0 10*3/uL (ref 0–0.1)
Eosinophils Absolute: 0.1 10*3/uL (ref 0–0.7)
Eosinophils Relative: 1 %
HCT: 29.4 % — ABNORMAL LOW (ref 35.0–47.0)
Hemoglobin: 9.5 g/dL — ABNORMAL LOW (ref 12.0–16.0)
Lymphs Abs: 0.4 10*3/uL — ABNORMAL LOW (ref 1.0–3.6)
MCH: 27.7 pg (ref 26.0–34.0)
MCHC: 32.1 g/dL (ref 32.0–36.0)
MCV: 86.3 fL (ref 80.0–100.0)
MONO ABS: 0.5 10*3/uL (ref 0.2–0.9)
Monocytes Relative: 3 %
NEUTROS ABS: 17.7 10*3/uL — AB (ref 1.4–6.5)
PLATELETS: 153 10*3/uL (ref 150–440)
RBC: 3.41 MIL/uL — ABNORMAL LOW (ref 3.80–5.20)
RDW: 18.8 % — AB (ref 11.5–14.5)
WBC: 18.7 10*3/uL — AB (ref 3.6–11.0)

## 2016-05-19 LAB — BASIC METABOLIC PANEL
Anion gap: 6 (ref 5–15)
BUN: 21 mg/dL — AB (ref 6–20)
CHLORIDE: 121 mmol/L — AB (ref 101–111)
CO2: 18 mmol/L — AB (ref 22–32)
CREATININE: 0.78 mg/dL (ref 0.44–1.00)
Calcium: 7.5 mg/dL — ABNORMAL LOW (ref 8.9–10.3)
GFR calc Af Amer: >60 mL/min (ref >60)
GFR calc non Af Amer: >60 mL/min (ref >60)
Glucose, Bld: 107 mg/dL — ABNORMAL HIGH (ref 65–99)
Potassium: 3.3 mmol/L — ABNORMAL LOW (ref 3.5–5.1)
Sodium: 145 mmol/L (ref 135–145)

## 2016-05-19 LAB — MAGNESIUM
Magnesium: 1.8 mg/dL (ref 1.7–2.4)
Magnesium: 1.5 mg/dL — ABNORMAL LOW (ref 1.7–2.4)

## 2016-05-19 LAB — PHOSPHORUS
PHOSPHORUS: 2.7 mg/dL (ref 2.5–4.6)
PHOSPHORUS: 2.2 mg/dL — AB (ref 2.5–4.6)

## 2016-05-19 SURGERY — ENDOVASCULAR REPAIR/STENT GRAFT
Anesthesia: General

## 2016-05-19 MED ORDER — POTASSIUM PHOSPHATES 15 MMOLE/5ML IV SOLN
18.0000 mmol | Freq: Once | INTRAVENOUS | Status: AC
  Administered 2016-05-19: 18 mmol via INTRAVENOUS
  Filled 2016-05-19: qty 6

## 2016-05-19 MED ORDER — IOPAMIDOL (ISOVUE-300) INJECTION 61%
INTRAVENOUS | Status: DC | PRN
  Administered 2016-05-19: 50 mL via INTRAVENOUS

## 2016-05-19 MED ORDER — ROCURONIUM BROMIDE 100 MG/10ML IV SOLN
INTRAVENOUS | Status: DC | PRN
  Administered 2016-05-19: 50 mg via INTRAVENOUS

## 2016-05-19 MED ORDER — SUCCINYLCHOLINE CHLORIDE 20 MG/ML IJ SOLN
INTRAMUSCULAR | Status: DC | PRN
  Administered 2016-05-19: 100 mg via INTRAVENOUS

## 2016-05-19 MED ORDER — FENTANYL CITRATE (PF) 100 MCG/2ML IJ SOLN: 25.0000 ug | INTRAMUSCULAR | Status: DC | PRN

## 2016-05-19 MED ORDER — FENTANYL CITRATE (PF) 100 MCG/2ML IJ SOLN
INTRAMUSCULAR | Status: DC | PRN
  Administered 2016-05-19: 100 ug via INTRAVENOUS
  Administered 2016-05-19: 150 ug via INTRAVENOUS

## 2016-05-19 MED ORDER — MAGNESIUM SULFATE 4 GM/100ML IV SOLN
4.0000 g | Freq: Once | INTRAVENOUS | Status: AC
  Administered 2016-05-20: 4 g via INTRAVENOUS
  Filled 2016-05-19: qty 100

## 2016-05-19 MED ORDER — METOPROLOL TARTRATE 5 MG/5ML IV SOLN
INTRAVENOUS | Status: DC | PRN
Start: 2016-05-19 — End: 2016-05-20
  Administered 2016-05-19: 2 mg via INTRAVENOUS

## 2016-05-19 MED ORDER — ONDANSETRON HCL 4 MG/2ML IJ SOLN: 4.0000 mg | Freq: Once | INTRAMUSCULAR | Status: DC | PRN

## 2016-05-19 MED ORDER — SUGAMMADEX SODIUM 500 MG/5ML IV SOLN
INTRAVENOUS | Status: DC | PRN
  Administered 2016-05-19: 150 mg via INTRAVENOUS

## 2016-05-19 MED ORDER — PROPOFOL 10 MG/ML IV BOLUS
INTRAVENOUS | Status: DC | PRN
  Administered 2016-05-19: 80 mg via INTRAVENOUS

## 2016-05-19 MED ORDER — HEPARIN SODIUM (PORCINE) 1000 UNIT/ML IJ SOLN
INTRAMUSCULAR | Status: DC | PRN
  Administered 2016-05-19: 6000 [IU] via INTRAVENOUS

## 2016-05-19 MED ORDER — EPINEPHRINE HCL 0.1 MG/ML IJ SOSY
PREFILLED_SYRINGE | INTRAMUSCULAR | Status: DC | PRN
  Administered 2016-05-19: 20 ug via INTRAVENOUS

## 2016-05-19 MED ORDER — PHENYLEPHRINE HCL 10 MG/ML IJ SOLN
10.0000 mg | INTRAVENOUS | Status: DC | PRN
  Administered 2016-05-19: 15 ug/min via INTRAVENOUS

## 2016-05-19 MED ORDER — POTASSIUM CHLORIDE 10 MEQ/100ML IV SOLN
10.0000 meq | INTRAVENOUS | Status: AC
Start: 2016-05-20 — End: 2016-05-20
  Administered 2016-05-19 – 2016-05-20 (×4): 10 meq via INTRAVENOUS
  Filled 2016-05-19 (×4): qty 100

## 2016-05-19 MED ORDER — KETAMINE HCL 50 MG/ML IJ SOLN
INTRAMUSCULAR | Status: DC | PRN
  Administered 2016-05-19: 25 mg via INTRAMUSCULAR

## 2016-05-19 MED ORDER — PHENYLEPHRINE HCL 10 MG/ML IJ SOLN
INTRAMUSCULAR | Status: DC | PRN
  Administered 2016-05-19: 100 ug via INTRAVENOUS

## 2016-05-19 SURGICAL SUPPLY — 44 items
BLADE SURG 15 STRL LF DISP TIS (BLADE) ×1 IMPLANT
BLADE SURG 15 STRL SS (BLADE) ×2
BLADE SURG SZ11 CARB STEEL (BLADE) ×3 IMPLANT
BOOT SUTURE AID YELLOW STND (SUTURE) ×3 IMPLANT
BRUSH SCRUB 4% CHG (MISCELLANEOUS) ×3 IMPLANT
CATH ACCU-VU SIZ PIG 5F 70CM (CATHETERS) ×3 IMPLANT
CATH BALLN CODA 9X100X32 (BALLOONS) ×3 IMPLANT
CATH KA2 5FR 65CM (CATHETERS) ×3 IMPLANT
DEVICE CLOSURE PERCLS PRGLD 6F (VASCULAR PRODUCTS) ×5 IMPLANT
DEVICE SAFEGUARD 24CM (GAUZE/BANDAGES/DRESSINGS) ×6 IMPLANT
DRYSEAL FLEXSHEATH 12FR 33CM (SHEATH) ×2
DRYSEAL FLEXSHEATH 18FR 33CM (SHEATH) ×3 IMPLANT
ELECT CAUTERY BLADE 6.4 (BLADE) ×3 IMPLANT
ELECT REM PT RETURN 9FT ADLT (ELECTROSURGICAL) ×3
ELECTRODE REM PT RTRN 9FT ADLT (ELECTROSURGICAL) ×1 IMPLANT
EXCLUDER TNK 28X14.5MMX12CM (Endovascular Graft) ×1 IMPLANT
EXCLUDER TRUNK 28X14.5MMX12CM (Endovascular Graft) ×3 IMPLANT
GLIDEWIRE STIFF .35X180X3 HYDR (WIRE) ×3 IMPLANT
GLOVE BIO SURGEON STRL SZ7 (GLOVE) ×12 IMPLANT
GLOVE SURG SYN 8.0 (GLOVE) ×3 IMPLANT
GOWN STRL REUS W/ TWL LRG LVL3 (GOWN DISPOSABLE) ×2 IMPLANT
GOWN STRL REUS W/ TWL XL LVL3 (GOWN DISPOSABLE) ×2 IMPLANT
GOWN STRL REUS W/TWL LRG LVL3 (GOWN DISPOSABLE) ×4
GOWN STRL REUS W/TWL XL LVL3 (GOWN DISPOSABLE) ×4
GRAFT EXCLUDER AORTIC 28X3.3CM (Endovascular Graft) ×3 IMPLANT
IV NS 1000ML (IV SOLUTION) ×2
IV NS 1000ML BAXH (IV SOLUTION) ×1 IMPLANT
LEG CONTRALATERAL 16X18X9.5 (Endovascular Graft) ×3 IMPLANT
LEG CONTRALETERAL16X16X11.5 (Endovascular Graft) ×2 IMPLANT
LIQUID BAND (GAUZE/BANDAGES/DRESSINGS) ×3 IMPLANT
PACK ANGIOGRAPHY (CUSTOM PROCEDURE TRAY) ×3 IMPLANT
PACK BASIN MAJOR ARMC (MISCELLANEOUS) ×3 IMPLANT
PERCLOSE PROGLIDE 6F (VASCULAR PRODUCTS) ×15
SHEATH BRITE TIP 6FRX11 (SHEATH) ×6 IMPLANT
SHEATH BRITE TIP 8FRX11 (SHEATH) ×6 IMPLANT
SHEATH DRYSEAL FLEX 12FR 33CM (SHEATH) ×1 IMPLANT
STENT GRAFT CONTRALAT 16X11.5 (Endovascular Graft) ×1 IMPLANT
SUT MNCRL 4-0 (SUTURE) ×2
SUT MNCRL 4-0 27XMFL (SUTURE) ×1
SUT PROLENE 6 0 BV (SUTURE) ×3 IMPLANT
SUTURE MNCRL 4-0 27XMF (SUTURE) ×1 IMPLANT
SYR 20CC LL (SYRINGE) ×3 IMPLANT
WIRE AMPLATZ SSTIFF .035X260CM (WIRE) ×6 IMPLANT
WIRE J 3MM .035X145CM (WIRE) ×6 IMPLANT

## 2016-05-19 NOTE — Op Note (Signed)
OPERATIVE NOTE   PROCEDURE: 1. US guidance for vascular access, bilateral femoral arteries 2. Catheter placement into aorta from bilateral femoral approaches 3. Placement of a 28 mm diameter proximal, 12 cm length Gore Excluder Endoprosthesis main body right with a 16 mm diameter x 12 cm left contralateral limb 4. Placement of an 18 mm diameter 10 cm length right iliac extension limb 5. Placement of a 28 mm diameter proximal aortic cuff 6. ProGlide closure devices bilateral femoral arteries  PRE-OPERATIVE DIAGNOSIS: AAA  POST-OPERATIVE DIAGNOSIS: same  SURGEON: Festus Barren, MD and Levora Dredge, MD - Co-surgeons  ANESTHESIA: general  ESTIMATED BLOOD LOSS: 25 cc  FINDING(S): 1.  AAA  SPECIMEN(S):  none  INDICATIONS:   Amanda Mcdowell is a 82 y.o. female who presents with hypotension and multiple medical issues.  CT showed a contained rupture of a very large, nearly 9 cm AAA.  Endovascular repair is being performed.  DESCRIPTION: After obtaining full informed written consent, the patient was brought back to the operating room and placed supine upon the operating table.  The patient received IV antibiotics prior to induction.  After obtaining adequate anesthesia, the patient was prepped and draped in the standard fashion for endovascular AAA repair.  We then began by gaining access to both femoral arteries with US guidance with me working on the left and Dr. Gilda Crease working on the right.  The femoral arteries were found to be patent and accessed without difficulty with a needle under ultrasound guidance without difficulty on each side and permanent images were recorded.  We then placed 2 proglide devices on each side in a pre-close fashion and placed 8 French sheaths. The patient was then given 6000 units of intravenous heparin. The Pigtail catheter was placed into the aorta from the right side. Using this image, we selected a 28 mm diameter, 12 cm length Main body device.  Over a stiff  wire, an 70 French sheath was placed up the right side. The main body was then placed through the 18 French sheath. A Kumpe catheter was placed up the left side and a magnified image at the renal arteries was performed. The main body was then deployed just below the lowest (left) renal artery. The Kumpe catheter was used to cannulate the contralateral gate without difficulty and successful cannulation was confirmed by twirling the pigtail catheter in the main body. We then placed a stiff wire and a retrograde arteriogram was performed through the left femoral sheath. We upsized to the 12 [place] sheath on the left for the contralateral limb and a 16 mm diameter x 12 cm length limb was selected and deployed. The main body deployment was then completed. Based off the angiographic findings, extension limbs were necessary.  An 18 mm diameter x 10 cm length iliac limb was needed on the right to get down to about 1 cm above the hypogastric artery.  The aorta required a proximal extension cuff as the main body moved to about 1 cm below the left renal artery.  A 28 mm cuff was deployed at the base of the left renal artery from the right femoral sheath. All junction points and seals zones were treated with the compliant balloon. The pigtail catheter was then replaced and a completion angiogram was performed.   No Endoleak was detected on completion angiography. The renal arteries were found to be patent, although the left renal artery was heavily diseased from atherosclerosis and about an 80-90% stenosis was seen.  The stent  graft did not impinge on the renal arteries at all. At this point we elected to terminate the procedure. We secured the pro glide devices for hemostasis on the femoral arteries. The skin incision was closed with a 4-0 Monocryl. Dermabond and pressure dressing were placed. The patient was taken to the recovery room in stable condition having tolerated the procedure well.  COMPLICATIONS:  none  CONDITION: stable  [name]  05/16/2016, 1:14 PM

## 2016-05-19 NOTE — OR Nursing (Signed)
Dorsalis Pedis pulses obtained and marked with doppler

## 2016-05-19 NOTE — Op Note (Signed)
OPERATIVE NOTE   PROCEDURE: 1. US guidance for vascular access, bilateral femoral arteries 2. Catheter placement into aorta from bilateral femoral approaches 3. Placement of a C3 Gore Excluder Endoprosthesis main body 28 x 14 x 12 with a 16 x 12 contralateral limb 4. Placement of a Gore Excluder endoprosthesis 18 x 10 iliac extension right iliac system 5. Placement of a Gore Excluder endoprosthesis 28 aortic cuff proximally 6. ProGlide closure devices bilateral femoral arteries  PRE-OPERATIVE DIAGNOSIS: AAA  POST-OPERATIVE DIAGNOSIS: same  SURGEON: Levora Dredge, MD and Festus Barren, MD - Co-surgeons  ANESTHESIA: general  ESTIMATED BLOOD LOSS: 100 cc  FINDING(S): 1.  AAA  SPECIMEN(S):  none  INDICATIONS:   Amanda Mcdowell is a 82 y.o. y.o. female who presents with ruptured abdominal aortic aneurysm.  DESCRIPTION: After obtaining full informed written consent, the patient was brought back to the operating room and placed supine upon the operating table.  The patient received IV antibiotics prior to induction.  After obtaining adequate anesthesia, the patient was prepped and draped in the standard fashion for endovascular AAA repair.  We then began by gaining access to both femoral arteries with US guidance with me working on the right and Dr. Wyn Quaker working on the left.  The femoral arteries were found to be patent and accessed without difficulty with a needle under ultrasound guidance without difficulty on each side and permanent images were recorded.  We then placed 2 proglide devices on each side in a pre-close fashion and placed 8 French sheaths. The patient was then given  6000 units of intravenous heparin.   The Pigtail catheter was placed into the aorta from the  left side. Using this image, we selected a 28 x 14 x 12 Main body device.  Over a stiff wire, an 68 French sheath was placed. The main body was then placed through the 18 French sheath. A Kumpe catheter was placed up the  left side and a magnified image at the renal arteries was performed. The main body was then deployed just below the lowest renal artery which was the left.   The Kumpe catheter was used to cannulate the contralateral gate without difficulty and successful cannulation was confirmed by twirling the pigtail catheter in the main body. We then placed a stiff wire and a retrograde arteriogram was performed through the left femoral sheath. We upsized to the 12 [place] sheath for the contralateral limb and a 16 x 12 limb was selected and deployed. The main body deployment was then completed. Based off the angiographic findings, extension limbs were necessary on the right.  Oblique imaging of the right side was then obtained and a 18 x 10 iliac extension limb was opened onto the field and advanced up the stiff wire on the right side.  It was deployed with its distal and just above the iliac bifurcation.   All junction points and seals zones were treated with the compliant balloon. The pigtail catheter was then replaced and a completion angiogram was performed.   No endoleak was detected on completion angiography. The renal arteries were found to be widely patent.  however, the proximal edge of the graft was now noted to be approximately 10 mm below the renals. Given the fact that the main body had come down by that much a 28 mm aortic cuff was advanced up the right side over the stiff wire. Pigtail catheter was up the left side and a magnified image of the renal arteries was again  obtained. The 28 mm aortic cuff was then deployed without difficulty at the inferior margin of the left renal artery.   At this point we elected to terminate the procedure. We secured the pro glide devices for hemostasis on the femoral arteries. The skin incision was closed with a 4-0 Monocryl. Dermabond and pressure dressing were placed. The patient was taken to the recovery room in stable condition having tolerated the procedure  well.  COMPLICATIONS: none  CONDITION: stable  Amanda Mcdowell  04/19/2015, 3:51 PM

## 2016-05-19 NOTE — Progress Notes (Signed)
[facility] [place] [facility]                                                                                                                                                                                            Patient Demographics   Amanda Mcdowell, is a 82 y.o. female, DOB - [birth year], SHF:[id number]  Admit date - 05/29/2016   Admitting Physician Shaune Pollack, MD  Outpatient Primary MD for the patient is No primary care provider on file.   LOS - 4  Subjective:CT scan showed a contained rupture of infrarenal abdominal aorta measuring A 0.3 x 9 cm as well as extending to a hematoma in the anterior left abdominal. Vascular surgery consult has been obtained. They will be planning to operate on her later today. Patient's heart rate continues to be elevated on a Cardizem drip. Patient more awake today  Review of Systems:    CONSTITUTIONAL: No documented fever. No fatigue, weakness. No weight gain, no weight loss.  EYES: No blurry or double vision.  ENT: No tinnitus. No postnasal drip. No redness of the oropharynx.  RESPIRATORY: No cough, no wheeze, no hemoptysis. No dyspnea.  CARDIOVASCULAR: No chest pain. No orthopnea. No palpitations. No syncope.  GASTROINTESTINAL: No nausea, no vomiting or diarrhea positive abdominal pain. No melena or hematochezia.  GENITOURINARY:  No urgency. No frequency. No dysuria. No hematuria. No obstructive symptoms. No discharge. No pain. No significant abnormal bleeding ENDOCRINE: No polyuria or nocturia. No heat or cold intolerance.  HEMATOLOGY: No anemia. No bruising. No bleeding. No purpura. No petechiae INTEGUMENTARY: No rashes. No lesions.  MUSCULOSKELETAL: No arthritis. No swelling. No gout.  NEUROLOGIC: No numbness, tingling, or ataxia. No seizure-type activity.  PSYCHIATRIC: No anxiety. No insomnia. No ADD.     Vitals:   Filed Vitals:   05/30/2016 0200 05/30/2016 0300 May 30, 2016 0400 May 30, 2016 0500  BP: 106/51 110/80 92/75  114/84  Pulse: 132 34    Temp:      TempSrc:      Resp: 18 18 21 18   Height:      Weight:      SpO2: 100% 100%      Wt Readings from Last 3 Encounters:  06/03/2016 75.5 kg (166 lb 7.2 oz)     Intake/Output Summary (Last 24 hours) at 2016-05-30 0838 Last data filed at 30-May-2016 0540  Gross per 24 hour  Intake 1863.33 ml  Output   1400 ml  Net 463.33 ml    Physical Exam:   GENERAL: Critically ill-appearing HEAD, EYES, EARS, NOSE AND THROAT: Atraumatic, normocephalic. Pale conjunctiva. Pupils equal and reactive to light.  Sclerae anicteric.  No oro-pharyngeal erythema.  NECK: Supple. There is no jugular venous distention. No bruits, no lymphadenopathy, no thyromegaly.  HEART: Irregularly irregular. No murmurs, no rubs, no clicks.  LUNGS: Clear to auscultation bilaterally. No rales or rhonchi. No wheezes.  ABDOMEN: Soft, flat, epigastric tenderness, nondistended. Has good bowel sounds. No hepatosplenomegaly appreciated.  EXTREMITIES: No evidence of any cyanosis, clubbing, or peripheral edema.  +2 pedal and radial pulses bilaterally.  NEUROLOGIC:Currently drowsy  SKIN: Moist and warm with no rashes appreciated.  Psych: Not anxious, depressed LN: No inguinal LN enlargement    Antibiotics   Anti-infectives    Start     Dose/Rate Route Frequency Ordered Stop   05/17/16 0500  vancomycin (VANCOCIN) IVPB 1000 mg/200 mL premix  Status:  Discontinued     1,000 mg 200 mL/hr over 60 Minutes Intravenous Every 24 hours 05/16/16 1156 05/16/16 1426   05/16/16 0300  vancomycin (VANCOCIN) 1,250 mg in sodium chloride 0.9 % 250 mL IVPB  Status:  Discontinued     1,250 mg 166.7 mL/hr over 90 Minutes Intravenous Every 36 hours 05/16/16 0048 05/16/16 1156   2016-05-27 1800  vancomycin (VANCOCIN) IVPB 1000 mg/200 mL premix     1,000 mg 200 mL/hr over 60 Minutes Intravenous  Once May 27, 2016 1717 05/27/2016 2030   05-27-16 1800  piperacillin-tazobactam (ZOSYN) IVPB 3.375 g     3.375 g 12.5 mL/hr over 240  Minutes Intravenous Every 8 hours 05/27/16 1733     05-27-16 1730  piperacillin-tazobactam (ZOSYN) IVPB 3.375 g  Status:  Discontinued     3.375 g 100 mL/hr over 30 Minutes Intravenous  Once 05/27/2016 1717 05/27/2016 1732   27-May-2016 1630  piperacillin-tazobactam (ZOSYN) IVPB 3.375 g  Status:  Discontinued     3.375 g 100 mL/hr over 30 Minutes Intravenous  Once 2016-05-27 1559 05/27/2016 1733   05-27-16 1630  vancomycin (VANCOCIN) IVPB 1000 mg/200 mL premix  Status:  Discontinued     1,000 mg 200 mL/hr over 60 Minutes Intravenous  Once 05/27/16 1602 2016-05-27 1734      Medications   Scheduled Meds: . sodium chloride   Intravenous Once  . sodium chloride   Intravenous Once  . sodium chloride   Intravenous Once  . antiseptic oral rinse  7 mL Mouth Rinse q12n4p  . chlorhexidine  15 mL Mouth Rinse BID  . ondansetron (ZOFRAN) IV  4 mg Intravenous Q6H  . pantoprazole (PROTONIX) IV  40 mg Intravenous Q12H  . piperacillin-tazobactam (ZOSYN)  IV  3.375 g Intravenous Q8H  . potassium phosphate IVPB (mmol)  18 mmol Intravenous Once  . sodium chloride flush  3 mL Intravenous Q12H   Continuous Infusions: . sodium chloride 75 mL/hr at 05/26/2016 0540  . diltiazem (CARDIZEM) infusion 5 mg/hr (06/05/2016 0540)   PRN Meds:.albuterol, morphine injection, ondansetron **OR** [DISCONTINUED] ondansetron (ZOFRAN) IV   Data Review:   Micro Results Recent Results (from the past 240 hour(s))  Urine culture     Status: None   Collection Time: 05/27/2016  4:00 PM  Result Value Ref Range Status   Specimen Description URINE, RANDOM  Final   Special Requests NONE  Final   Culture NO GROWTH Performed at [facility]   Final   Report Status 05/16/2016 FINAL  Final  MRSA PCR Screening     Status: None   Collection Time: 05/27/2016  5:51 PM  Result Value Ref Range Status   MRSA by PCR NEGATIVE NEGATIVE Final    Comment:  The GeneXpert MRSA Assay (FDA approved for NASAL specimens only), is one  component of a comprehensive MRSA colonization surveillance program. It is not intended to diagnose MRSA infection nor to guide or monitor treatment for MRSA infections.   Culture, blood (x 2)     Status: None (Preliminary result)   Collection Time: June 02, 2016  5:52 PM  Result Value Ref Range Status   Specimen Description BLOOD LEFT AC  Final   Special Requests   Final    BOTTLES DRAWN AEROBIC AND ANAEROBIC  ANA , AER 2 ML   Culture NO GROWTH 3 DAYS  Final   Report Status PENDING  Incomplete  Culture, blood (x 2)     Status: None (Preliminary result)   Collection Time: 2016/06/02  7:17 PM  Result Value Ref Range Status   Specimen Description BLOOD LEFT WRIST  Final   Special Requests   Final    BOTTLES DRAWN AEROBIC AND ANAEROBIC AEROBIC 15CC, ANAEROBIC 10CC   Culture NO GROWTH 3 DAYS  Final   Report Status PENDING  Incomplete  Urine culture     Status: None   Collection Time: 06/02/16 10:06 PM  Result Value Ref Range Status   Specimen Description URINE, CLEAN CATCH  Final   Special Requests Normal  Final   Culture NO GROWTH Performed at [facility]   Final   Report Status 05/18/2016 FINAL  Final    Radiology Reports Ct Abdomen Pelvis Wo Contrast  05/18/2016  CLINICAL DATA:  Abdomen pain.  Upper GI bleeding. EXAM: CT ABDOMEN AND PELVIS WITHOUT CONTRAST TECHNIQUE: Multidetector CT imaging of the abdomen and pelvis was performed following the standard protocol without IV contrast. COMPARISON:  January 20, 2015 FINDINGS: Lower chest: The heart has is enlarged. There are small bilateral pleural effusions. Patchy atelectasis is identified in bilateral lung bases. Hepatobiliary: The liver and gallbladder are normal. No mass visualized on this un-enhanced exam. Pancreas: No mass or inflammatory process identified on this un-enhanced exam. Spleen: Within normal limits in size. Adrenals/Urinary Tract: The adrenal glands are normal. Right renal vascular calcification is noted. No  evidence of urolithiasis or hydronephrosis. No definite mass visualized on this un-enhanced exam. Evaluation of bladder is limited due to metallic artifact from bilateral hip replacements. Stomach/Bowel: PEG tube is identified. There is a broad-based anterior abdominal and pelvic herniation of stomach, bowel loops and mesenteric fat. There are few mildly dilated small bowel bowel loops in the anterior pelvis. There is mild diffuse bowel wall thickening of the sigmoid colon. Vascular/Lymphatic: There is contained rupture of the infrarenal abdominal aorta measuring 8.3 x 9 cm. There is fluid/ blood tracking from the inferior portion of the contained abdominal aortic rupture extending to a hematoma in the anterior left abdomen measuring 8.9 x 5.4 cm. No pathologically enlarged lymph nodes. Reproductive: Evaluation is limited due to metallic artifact from bilateral hip replacements. Other: None. Musculoskeletal: Extensive degenerative joint changes are identified in the spine. Bilateral hip replacements are noted. IMPRESSION: contained rupture of the infrarenal abdominal aorta measuring 8.3 x 9 cm. There is fluid/ blood tracking from the inferior portion of the contained abdominal aortic rupture extending to a hematoma in the anterior left abdomen measuring 8.9 x 5.4 cm. Mildly dilated small bowel loops in the pelvis with mild diffuse thick wall sigmoid colon. Critical Value/emergent results were called by telephone at the time of interpretation on 05/18/2016 at 4:22 pm to Dr. Belia Heman, who verbally acknowledged these results. Electronically Signed   By: Scherry Ran  Juel Burrow M.D.   On: 05/18/2016 16:22   Dg Abd 1 View  05/16/2016  CLINICAL DATA:  Abdominal pain EXAM: ABDOMEN - 1 VIEW COMPARISON:  May 18, 2016 FINDINGS: Gastrostomy tube projects over the left abdomen. Nonobstructive bowel gas pattern. No free air organomegaly. No acute bony abnormality. Bilateral hip replacements noted. IMPRESSION: No acute findings. Electronically  Signed   By: Charlett Nose M.D.   On: 05/16/2016 07:21   Dg Abd 1 View  May 18, 2016  CLINICAL DATA:  Pain, vomiting. EXAM: ABDOMEN - 1 VIEW COMPARISON:  None FINDINGS: Nonspecific bowel gas pattern. Mild dilatation of bowel in the right pelvis which is likely the cecum filled with stool. No convincing evidence for bowel obstruction. No organomegaly or free air. Gastrostomy tube projects over the stomach. Visualized lung bases are clear. IMPRESSION: Nonspecific bowel gas pattern without evidence for obstruction. Large stool burden in the right colon. Electronically Signed   By: Charlett Nose M.D.   On: 05/18/2016 13:53   Dg Chest Portable 1 View  18-May-2016  CLINICAL DATA:  Pain, vomiting. EXAM: PORTABLE CHEST 1 VIEW COMPARISON:  None. FINDINGS: Mild cardiomegaly. Minimal left base atelectasis. Right lung is clear. No effusions. Heart is normal size. IMPRESSION: Left base atelectasis.  Mild cardiomegaly. Electronically Signed   By: Charlett Nose M.D.   On: 05/18/16 13:53     CBC  Recent Labs Lab May 18, 2016 1306  05/16/16 0539 05/16/16 1326 05/17/16 0436 05/18/16 0504 05/31/2016  WBC 29.6*  --  27.2* 24.2* 16.5* 17.5* 18.7*  HGB 10.3*  < > 8.2* 9.2* 8.0* 10.0* 9.5*  HCT 32.4*  --  25.3* 28.5* 25.1* 30.8* 29.4*  PLT 251  --  172 160 152 159 153  MCV 87.0  --  88.0 87.9 87.9 84.7 86.3  MCH 27.7  --  28.6 28.3 28.1 27.6 27.7  MCHC 31.8*  --  32.5 32.2 32.0 32.6 32.1  RDW 18.3*  --  17.7* 17.6* 17.8* 18.8* 18.8*  LYMPHSABS 0.8*  --  0.7*  --  0.3* 0.5* 0.4*  MONOABS 1.4*  --  0.7  --  0.6 0.7 0.5  EOSABS 0.0  --  0.0  --  0.0 0.0 0.1  BASOSABS 0.1  --  0.0  --  0.0 0.0 0.0  < > = values in this interval not displayed.  Chemistries   Recent Labs Lab 05/18/16 1306 2016/05/18 1752 05/16/16 0539 05/17/16 0436 05/18/16 0504 05/18/16 1258 05/18/16 1859 05/30/2016  NA 135  --  138 144 146*  --   --  147*  K 4.2  --  4.0 3.7 2.9* 4.6 3.1* 3.7  CL 96*  --  111 119* 120*  --   --  122*  CO2 23  --   20* 19* 19*  --   --  18*  GLUCOSE 171*  --  129* 134* 100*  --   --  75  BUN 53*  --  48* 37* 28*  --   --  25*  CREATININE 1.43*  --  1.15* 1.06* 0.91  --   --  0.86  CALCIUM 9.0  --  7.1* 7.1* 7.8*  --   --  7.9*  MG  --  2.4 2.1 2.1 2.0  --   --  1.8  AST 30  --  32  --   --   --   --   --   ALT 17  --  16  --   --   --   --   --  ALKPHOS 74  --  54  --   --   --   --   --   BILITOT 0.8  --  1.2  --   --   --   --   --    ------------------------------------------------------------------------------------------------------------------ estimated creatinine clearance is 47 mL/min (by C-G formula based on Cr of 0.86). ------------------------------------------------------------------------------------------------------------------ No results for input(s): HGBA1C in the last 72 hours. ------------------------------------------------------------------------------------------------------------------ No results for input(s): CHOL, HDL, LDLCALC, TRIG, CHOLHDL, LDLDIRECT in the last 72 hours. ------------------------------------------------------------------------------------------------------------------ No results for input(s): TSH, T4TOTAL, T3FREE, THYROIDAB in the last 72 hours.  Invalid input(s): FREET3 ------------------------------------------------------------------------------------------------------------------ No results for input(s): VITAMINB12, FOLATE, FERRITIN, TIBC, IRON, RETICCTPCT in the last 72 hours.  Coagulation profile  Recent Labs Lab 05/23/2016 1306 05/16/16 0539  INR 1.16 1.23    No results for input(s): DDIMER in the last 72 hours.  Cardiac Enzymes  Recent Labs Lab 06/02/2016 1752 05/21/2016 2325 05/16/16 0539  TROPONINI 0.29* 0.28* 0.19*   ------------------------------------------------------------------------------------------------------------------ Invalid input(s): POCBNP    Assessment & Plan   Patient is a 83 year old African-American female  presenting with upper GI bleed  1. AAA with rupture- plan for stent and repair, supportive care Plan for or later today    2. UPPER GI bleeding and anemia due to acute blood loss Could be related to her abdominal aortic rupture. Hemoglobin is currently stable drifted down little bit. Patient will likely need transfusion check hemoglobin postop    3. A flutter with RVR,  Continue IV Cardizem Titrate to keep heart rate less than 100 Appreciate cardiology input  4. Sepsis and hypotension Suspect due to abdominal aortic aneurysm rupture continue Zosyn  Likely can discontinue antibiotics tomorrow   5. Acute renal failure  improved  6. Elevated troponin due to demand ischemia  7. Severe hypokalemia replaced  8. Code DO NOT RESUSCITATEHigh risk of cardiopulmonary complications with abdominal aortic surgery family understands they want to proceed     Code Status Orders        Start     Ordered   06/02/2016 1718  Do not attempt resuscitation (DNR)   Continuous    Question Answer Comment  In the event of cardiac or respiratory ARREST Do not call a "code blue"   In the event of cardiac or respiratory ARREST Do not perform Intubation, CPR, defibrillation or ACLS   In the event of cardiac or respiratory ARREST Use medication by any route, position, wound care, and other measures to relive pain and suffering. May use oxygen, suction and manual treatment of airway obstruction as needed for comfort.      06/04/2016 1717    Code Status History    Date Active Date Inactive Code Status Order ID Comments User Context   This patient has a current code status but no historical code status.    Advance Directive Documentation        Most Recent Value   Type of Advance Directive  Out of facility DNR (pink MOST or yellow form)   Pre-existing out of facility DNR order (yellow form or pink MOST form)  Yellow form placed in chart (order not valid for inpatient use)   "MOST" Form in Place?              Consults GI, CArdiology  DVT ProphylaxisSCDs  Lab Results  Component Value Date   PLT 153 06-14-16     Time Spent in minutes  critical care time spent   Greater than 50% of  time spent in care coordination and counseling patient regarding the condition and plan of care.   Auburn Bilberry M.D on June 02, 2016 at 8:38 AM  Between 7am to 6pm - Pager - [phone]  After 6pm go to [email or web address] - password [email or web address] [facility]  [facility]   Office  [phone]

## 2016-05-19 NOTE — Care Management (Signed)
Barriers to DC- Ruptured aortic aneurysm with hematoma found on CT.  Repair 6/7.

## 2016-05-19 NOTE — Anesthesia Preprocedure Evaluation (Addendum)
Anesthesia Evaluation  Patient identified by MRN, date of birth, ID band Patient awake    Reviewed: Allergy & Precautions, H&P , NPO status , Patient's Chart, lab work & pertinent test results, reviewed documented beta blocker date and time   History of Anesthesia Complications Negative for: history of anesthetic complications  Airway Mallampati: III  TM Distance: >3 FB Neck ROM: full    Dental no notable dental hx. (+) Missing, Chipped, Poor Dentition   Pulmonary neg shortness of breath, neg sleep apnea, COPD, neg recent URI, former smoker,    Pulmonary exam normal breath sounds clear to auscultation       Cardiovascular Exercise Tolerance: Good hypertension, (-) angina+ Peripheral Vascular Disease and +CHF  (-) CAD, (-) Past MI, (-) Cardiac Stents and (-) CABG Normal cardiovascular exam+ dysrhythmias (1st degree AV block) Atrial Fibrillation (-) Valvular Problems/Murmurs Rhythm:regular Rate:Normal  Sick sinus syndrome, no pacemaker at this time   Neuro/Psych PSYCHIATRIC DISORDERS (Dementia and depression) negative neurological ROS     GI/Hepatic negative GI ROS, Neg liver ROS,   Endo/Other  negative endocrine ROS  Renal/GU ARFRenal disease  negative genitourinary   Musculoskeletal   Abdominal   Peds  Hematology  (+) Blood dyscrasia, anemia ,   Anesthesia Other Findings Past Medical History:   HTN (hypertension)                                           AV block, 1st degree                                         Atrial flutter (HCC)                                         COPD (chronic obstructive pulmonary disease) (*              RA (rheumatoid arthritis) (HCC)                              Anemia                                                       Hyperlipidemia                                               Heart failure (HCC)                                          UTI (lower urinary tract infection)                           SSS (sick sinus syndrome) (HCC)  Dementia                                                     Depression                                                   Appendicitis                                                 Reproductive/Obstetrics negative OB ROS                            Anesthesia Physical Anesthesia Plan  ASA: IV  Anesthesia Plan: General   Post-op Pain Management:    Induction:   Airway Management Planned:   Additional Equipment:   Intra-op Plan:   Post-operative Plan:   Informed Consent: I have reviewed the patients History and Physical, chart, labs and discussed the procedure including the risks, benefits and alternatives for the proposed anesthesia with the patient or authorized representative who has indicated his/her understanding and acceptance.   Dental Advisory Given  Plan Discussed with: Anesthesiologist, CRNA and Surgeon  Anesthesia Plan Comments:         Anesthesia Quick Evaluation

## 2016-05-19 NOTE — OR Nursing (Signed)
Groin prepped with shave and wipes, second IV not attempted due to poor veins, Dr Gilda Crease to put central line in during procedure.

## 2016-05-19 NOTE — Transfer of Care (Signed)
Immediate Anesthesia Transfer of Care Note  Patient: Amanda Mcdowell  Procedure(s) Performed: Procedure(s): Endovascular Repair/Stent Graft (N/A)  Patient Location: PACU  Anesthesia Type:General  Level of Consciousness: awake, alert  and patient cooperative  Airway & Oxygen Therapy: Patient Spontanous Breathing and Patient connected to face mask oxygen  Post-op Assessment: Report given to RN and Post -op Vital signs reviewed and stable  Post vital signs: Reviewed and stable  Last Vitals:  Filed Vitals:   05/14/2016 0900 06/11/2016 0952  BP: 116/69 122/72  Pulse: 47   Temp:    Resp: 17 19    Last Pain:  Filed Vitals:   05/15/2016 1339  PainSc: 5          Complications: No apparent anesthesia complications

## 2016-05-19 NOTE — Anesthesia Procedure Notes (Signed)
Procedure Name: Intubation Date/Time: 2016-06-06 11:34 AM Performed by: Lenard Simmer Pre-anesthesia Checklist: Patient identified, Emergency Drugs available, Suction available and Patient being monitored Patient Re-evaluated:Patient Re-evaluated prior to inductionOxygen Delivery Method: Circle system utilized Preoxygenation: Pre-oxygenation with 100% oxygen Intubation Type: IV induction Laryngoscope Size: Mac and 3 Grade View: Grade I Tube type: Oral Number of attempts: 1 Placement Confirmation: ETT inserted through vocal cords under direct vision,  positive ETCO2 and breath sounds checked- equal and bilateral Secured at: 21 cm Tube secured with: Tape Dental Injury: Teeth and Oropharynx as per pre-operative assessment

## 2016-05-19 NOTE — Op Note (Signed)
OPERATIVE NOTE   PROCEDURE: 1. Insertion of triple-lumen central venous catheter right IJ approach.  PRE-OPERATIVE DIAGNOSIS: Ruptured abdominal aortic aneurysm  POST-OPERATIVE DIAGNOSIS: Same  SURGEON: [name] M.D.  ANESTHESIA: 1% lidocaine local infiltration  ESTIMATED BLOOD LOSS: Minimal cc  INDICATIONS:   Amanda Mcdowell is a 82 y.o. female who presents with hemodynamic instability with a ruptured abdominal aortic aneurysm.  DESCRIPTION: After obtaining full informed written consent, the patient was positioned supine. The right neck was prepped and draped in a sterile fashion. Ultrasound was placed in a sterile sleeve. Ultrasound was utilized to identify the right internal jugular vein which is noted to be echolucent and compressible indicating patency. Images recorded for the permanent record. Under real-time visualization a Seldinger needle is inserted into the vein and the guidewires advanced without difficulty. Small counterincision was made at the wire insertion site. Dilators passed over the wire and the triple-lumen catheter is fed without difficulty.  All 3 lm aspirate and flush easily and are packed with heparin saline. Catheter secured to the skin of the right neck with 2-0 silk. A sterile dressing is applied with Biopatch.  COMPLICATIONS: None  CONDITION: Good  [name], M.D. [facility]. Office:  [phone]   05/20/2016, 11:55 AM

## 2016-05-19 NOTE — Progress Notes (Signed)
Pharmacy Consult for Electrolyte Monitoring Indication: Hypokalemia  Allergies  Allergen Reactions  . Atenolol Other (See Comments)    Unknown reaction.   Patient Measurements: Height: 5\' 6"  (167.6 cm) Weight: 166 lb 7.2 oz (75.5 kg) IBW/kg (Calculated) : 59.3  Vital Signs: Temp: 98.4 F (36.9 C) (06/07 2000) Temp Source: Axillary (06/07 2000) BP: 121/75 mmHg (06/07 2200) Pulse Rate: 49 (06/07 2100) Intake/Output from previous day: 06/06 0701 - 06/07 0700 In: 2470 [I.V.:1920; IV Piggyback:550] Out: 1400 [Urine:1100; Stool:300] Intake/Output from this shift: Total I/O In: 339 [I.V.:339] Out: -   Labs:  Recent Labs  05/17/16 0436 05/18/16 0504 06/07/2016  WBC 16.5* 17.5* 18.7*  HGB 8.0* 10.0* 9.5*  HCT 25.1* 30.8* 29.4*  PLT 152 159 153     Recent Labs  05/18/16 0504  05/18/16 1859 06/01/2016 06/01/2016 2220  NA 146*  --   --  147* 145  K 2.9*  < > 3.1* 3.7 3.3*  CL 120*  --   --  122* 121*  CO2 19*  --   --  18* 18*  GLUCOSE 100*  --   --  75 107*  BUN 28*  --   --  25* 21*  CREATININE 0.91  --   --  0.86 0.78  CALCIUM 7.8*  --   --  7.9* 7.5*  MG 2.0  --   --  1.8 1.5*  PHOS 2.6  --   --  2.2* 2.7  < > = values in this interval not displayed. Estimated Creatinine Clearance: 50.5 mL/min (by C-G formula based on Cr of 0.78).   No results for input(s): GLUCAP in the last 72 hours.  Medical History: Past Medical History  Diagnosis Date  . HTN (hypertension)   . AV block, 1st degree   . Atrial flutter (HCC)   . COPD (chronic obstructive pulmonary disease) (HCC)   . RA (rheumatoid arthritis) (HCC)   . Anemia   . Hyperlipidemia   . Heart failure (HCC)   . UTI (lower urinary tract infection)   . SSS (sick sinus syndrome) (HCC)   . Dementia   . Depression   . Appendicitis     Medications:  Scheduled:  . sodium chloride   Intravenous Once  . sodium chloride   Intravenous Once  . sodium chloride   Intravenous Once  . antiseptic oral rinse  7 mL Mouth  Rinse q12n4p  . chlorhexidine  15 mL Mouth Rinse BID  . ondansetron (ZOFRAN) IV  4 mg Intravenous Q6H  . pantoprazole (PROTONIX) IV  40 mg Intravenous Q12H  . piperacillin-tazobactam (ZOSYN)  IV  3.375 g Intravenous Q8H  . sodium chloride flush  3 mL Intravenous Q12H   Infusions:  . sodium chloride 75 mL/hr at 06/01/2016 2200  . diltiazem (CARDIZEM) infusion 10 mg/hr (05/26/2016 2200)    Assessment: Pharmacy consulted to assist in managing and replacing electrolytes in this 82 y/o F admitted with GIB.   Follow up K of 3.1 is low  Plan:  K 3.3, Mg 1.5, phos 2.7. Will order potassium chloride 10 mEq IV Q1H x 4 doses and magnesium sulfate 4 gm IV x 1. Recheck electrolytes AM tomorrow.  98, Pharm.D., BCPS Clinical Pharmacist 05/31/2016,11:25 PM

## 2016-05-19 NOTE — Progress Notes (Signed)
Pt alert and oriented to self this morning with c/o abdominal pain. Morphine 1mg  given. Lung sounds are clear to auscultation on RA. Continues in Atrial Fib on cardiac monitor. Pt sent to specials for AAA repair.

## 2016-05-20 ENCOUNTER — Encounter: Payer: Self-pay | Admitting: Vascular Surgery

## 2016-05-20 LAB — CBC
HEMATOCRIT: 26.5 % — AB (ref 35.0–47.0)
HEMOGLOBIN: 8.6 g/dL — AB (ref 12.0–16.0)
MCH: 27.8 pg (ref 26.0–34.0)
MCHC: 32.5 g/dL (ref 32.0–36.0)
MCV: 85.5 fL (ref 80.0–100.0)
Platelets: 102 10*3/uL — ABNORMAL LOW (ref 150–440)
RBC: 3.09 MIL/uL — ABNORMAL LOW (ref 3.80–5.20)
RDW: 18.3 % — AB (ref 11.5–14.5)
WBC: 17.2 10*3/uL — AB (ref 3.6–11.0)

## 2016-05-20 LAB — BASIC METABOLIC PANEL
ANION GAP: 7 (ref 5–15)
BUN: 20 mg/dL (ref 6–20)
CHLORIDE: 120 mmol/L — AB (ref 101–111)
CO2: 18 mmol/L — ABNORMAL LOW (ref 22–32)
Calcium: 7.5 mg/dL — ABNORMAL LOW (ref 8.9–10.3)
Creatinine, Ser: 0.91 mg/dL (ref 0.44–1.00)
GFR calc Af Amer: >60 mL/min (ref >60)
GFR calc non Af Amer: 55 mL/min — ABNORMAL LOW (ref >60)
GLUCOSE: 102 mg/dL — AB (ref 65–99)
POTASSIUM: 3.9 mmol/L (ref 3.5–5.1)
SODIUM: 145 mmol/L (ref 135–145)

## 2016-05-20 LAB — CULTURE, BLOOD (ROUTINE X 2)
CULTURE: NO GROWTH
Culture: NO GROWTH

## 2016-05-20 LAB — GLUCOSE, CAPILLARY: Glucose-Capillary: 183 mg/dL — ABNORMAL HIGH (ref 65–99)

## 2016-05-20 LAB — MAGNESIUM: Magnesium: 2.5 mg/dL — ABNORMAL HIGH (ref 1.7–2.4)

## 2016-05-20 LAB — PHOSPHORUS: PHOSPHORUS: 2.5 mg/dL (ref 2.5–4.6)

## 2016-05-20 MED ORDER — OXYCODONE HCL 5 MG PO TABS: 5.0000 mg | ORAL_TABLET | ORAL | Status: DC | PRN

## 2016-05-20 MED ORDER — ACETAMINOPHEN 325 MG PO TABS: 650.0000 mg | ORAL_TABLET | Freq: Four times a day (QID) | ORAL | Status: DC | PRN

## 2016-05-20 MED ORDER — MORPHINE SULFATE (PF) 2 MG/ML IV SOLN
1.0000 mg | INTRAVENOUS | Status: DC | PRN
  Administered 2016-05-21: 2 mg via INTRAVENOUS
  Administered 2016-05-21 (×2): 1 mg via INTRAVENOUS
  Administered 2016-05-21 – 2016-05-22 (×4): 2 mg via INTRAVENOUS
  Filled 2016-05-20 (×8): qty 1

## 2016-05-20 MED ORDER — VITAL 1.5 CAL PO LIQD
1000.0000 mL | ORAL | Status: DC
  Administered 2016-05-20: 1000 mL

## 2016-05-20 MED ORDER — FREE WATER
100.0000 mL | Status: DC
  Administered 2016-05-20 – 2016-05-21 (×6): 100 mL

## 2016-05-20 MED ORDER — DILTIAZEM HCL 30 MG PO TABS
30.0000 mg | ORAL_TABLET | Freq: Four times a day (QID) | ORAL | Status: DC
  Administered 2016-05-20 – 2016-05-21 (×4): 30 mg via ORAL
  Filled 2016-05-20 (×4): qty 1

## 2016-05-20 NOTE — Progress Notes (Signed)
Pharmacy Consult for Electrolyte Monitoring Indication: Hypokalemia  Allergies  Allergen Reactions  . Atenolol Other (See Comments)    Unknown reaction.   Patient Measurements: Height: 5\' 6"  (167.6 cm) Weight: 166 lb 7.2 oz (75.5 kg) IBW/kg (Calculated) : 59.3  Vital Signs: Temp: 98.5 F (36.9 C) (06/08 0729) Temp Source: Axillary (06/08 0729) BP: 144/47 mmHg (06/08 1129) Pulse Rate: 47 (06/08 1129) Intake/Output from previous day: 06/07 0701 - 06/08 0700 In: 2113.4 [I.V.:2113.4] Out: 1305 [Urine:1200; Drains:100; Blood:5] Intake/Output from this shift: Total I/O In: 160 [I.V.:160] Out: 150 [Urine:150]  Labs:  Recent Labs  05/18/16 0504 05/28/2016 05/20/16 0731  WBC 17.5* 18.7* 17.2*  HGB 10.0* 9.5* 8.6*  HCT 30.8* 29.4* 26.5*  PLT 159 153 102*     Recent Labs  05/23/2016 05/26/2016 2220 05/20/16 0731  NA 147* 145 145  K 3.7 3.3* 3.9  CL 122* 121* 120*  CO2 18* 18* 18*  GLUCOSE 75 107* 102*  BUN 25* 21* 20  CREATININE 0.86 0.78 0.91  CALCIUM 7.9* 7.5* 7.5*  MG 1.8 1.5* 2.5*  PHOS 2.2* 2.7 2.5   Estimated Creatinine Clearance: 44.4 mL/min (by C-G formula based on Cr of 0.91).   No results for input(s): GLUCAP in the last 72 hours.  Medical History: Past Medical History  Diagnosis Date  . HTN (hypertension)   . AV block, 1st degree   . Atrial flutter (HCC)   . COPD (chronic obstructive pulmonary disease) (HCC)   . RA (rheumatoid arthritis) (HCC)   . Anemia   . Hyperlipidemia   . Heart failure (HCC)   . UTI (lower urinary tract infection)   . SSS (sick sinus syndrome) (HCC)   . Dementia   . Depression   . Appendicitis     Medications:  Scheduled:  . sodium chloride   Intravenous Once  . sodium chloride   Intravenous Once  . sodium chloride   Intravenous Once  . antiseptic oral rinse  7 mL Mouth Rinse q12n4p  . chlorhexidine  15 mL Mouth Rinse BID  . ondansetron (ZOFRAN) IV  4 mg Intravenous Q6H  . pantoprazole (PROTONIX) IV  40 mg  Intravenous Q12H  . piperacillin-tazobactam (ZOSYN)  IV  3.375 g Intravenous Q8H  . sodium chloride flush  3 mL Intravenous Q12H   Infusions:  . sodium chloride 75 mL/hr at 05/20/16 0845  . diltiazem (CARDIZEM) infusion 5 mg/hr (05/20/16 0600)    Assessment: Pharmacy consulted to assist in managing and replacing electrolytes in this 82 y/o F admitted with GIB.   Plan:  Electrolytes WNL, no additional supplementation at this time. Recheck electrolytes AM tomorrow.  Azaan Leask C, Pharm.D., Clinical Pharmacist 05/20/2016,11:40 AM

## 2016-05-20 NOTE — Progress Notes (Signed)
Spoke with Dr. Gilda Crease this AM re patient, will remove PDA.

## 2016-05-20 NOTE — Addendum Note (Signed)
Addendum  created 05/20/16 1006 by Darrol Jump, CRNA   Modules edited: Anesthesia Events, Narrator   Narrator:  Narrator: Event Log Edited

## 2016-05-20 NOTE — Progress Notes (Signed)
Report given to [facility] on 2A. Pt transfer to room 244 with NTs.

## 2016-05-20 NOTE — Progress Notes (Signed)
Handoff done with Abigail RN at bedside. Pt in bed and family at bedside.

## 2016-05-20 NOTE — Anesthesia Postprocedure Evaluation (Signed)
Anesthesia Post Note  Patient: Amanda Mcdowell  Procedure(s) Performed: Procedure(s) (LRB): Endovascular Repair/Stent Graft (N/A)  Patient location during evaluation: ICU Anesthesia Type: General Level of consciousness: sedated Pain management: pain level controlled Vital Signs Assessment: post-procedure vital signs reviewed and stable Respiratory status: respiratory function stable Cardiovascular status: stable Anesthetic complications: no    Last Vitals:  Filed Vitals:   05/20/16 0729 05/20/16 0742  BP:    Pulse:    Temp: 36.9 C 36.7 C  Resp:      Last Pain:  Filed Vitals:   05/20/16 0742  PainSc: [name]

## 2016-05-20 NOTE — Consult Note (Signed)
Patient VSS afeb, hgb 8.6, wbc 17.2, plt 102.  Abd with a few bowel sounds, PEG site dressing in place. No new suggestions, had sheath for AAA placed by vascular doctors.  Doing as well as possible.

## 2016-05-20 NOTE — Progress Notes (Signed)
Nutrition Follow-up  DOCUMENTATION CODES:   Non-severe (moderate) malnutrition in context of chronic illness  INTERVENTION:  -Discussed nutritional poc with MD Imogene Burn; received verbal order to start TF today via PEG tube. Recommend starting Vital 1.5 formula as pt not tolerating Jevity formula as outpatient. Recommend starting at rate of 20 ml/hr, goal of 45 ml/hr providing 1620 kcals, 69 g of protein, 1070 mL of free water. Assess tolerance -Continue FL diet order for now  NUTRITION DIAGNOSIS:   Inadequate oral intake related to acute illness as evidenced by NPO status.  Being addressed via TF, diet advanced  GOAL:   Patient will meet greater than or equal to 90% of their needs  MONITOR:   Labs, I & O's, Weight trends (Ability to restart TF)  REASON FOR ASSESSMENT:   Consult Assessment of nutrition requirement/status  ASSESSMENT:   82 yo female admitted after vomiting blood with GI bleed and anemia due to acute blood loss. . Pt with hx of multiple abdominal surgeries, bowel obstruction; per chart review, pt with J-tube placed in 2013 but currently with G-tube. Pt receives TF via G-tube and nothing by mouth. Per report, pt with significant vomiting for last 1-2 weeks but has been vomiting multiple times a week for the past 6 months. Pt with severe dementia at baseline.   NPO day 5. Pt s/p sheath placement for ruptured AAA yesterday. Flexiseal with 400 mL liquid stool prior to bag change out this AM.   Spoke with Dorrene German RD at [facility] yesterday. RD reports pt has been having difficulty tolerating TF, frequent vomiting with TF being held often. As documented yesterday, pt TF prescription was Jevity 1.5 at 45 ml/hr, any attempts to increase TF rate also cause N/V, intolerance. Pt also with weight trending down. RD reports pt allowed to take po and had Dysphagia III with ground meats and Thin Liquid diet order but pt did not eat much by mouth. Pt also on scheduled bowel  regimen; dulcolax suppository scheduled every other day, pt also receiving benefiber via tube. Pt weight has been trending down, 8.8 pounds in the pat 1 month (5.1% wt loss)  Diet Order:  Diet NPO time specified  Skin:   (deep tissue injury to heel)  Last BM:  05/20/16 liquid stool via rectal tube   Labs: Hgb 8.6, sodium 145 (trending down), potassium wdl  Meds:  NS at 75 ml/hr, potassium chloride  Height:   Ht Readings from Last 1 Encounters:  May 23, 2016 5\' 6"  (1.676 m)    Weight:   Wt Readings from Last 1 Encounters:  May 23, 2016 166 lb 7.2 oz (75.5 kg)   BMI:  Body mass index is 26.88 kg/(m^2).  Estimated Nutritional Needs:   Kcal:  1600-1800 kcals  Protein:  70-88 g  Fluid:  >/= 1.6 L  EDUCATION NEEDS:   No education needs identified at this time  07/15/16 MS, RD, LDN [phone] Pager  [phone] Weekend/On-Call Pager

## 2016-05-20 NOTE — Progress Notes (Signed)
Spoke with Dr. Imogene Burn and notified him that pt H/H is 8.6/26.5 sp AAA repair.

## 2016-05-20 NOTE — Progress Notes (Signed)
[facility] - [place] at [facility]                                                                                                                                                                                            Patient Demographics   Amanda Mcdowell, is a 82 y.o. female, DOB - 1926-05-04, ZOX:[id number]  Admit date - 2016/05/31   Admitting Physician Shaune Pollack, MD  Outpatient Primary MD for the patient is No primary care provider on file.   LOS - 5  Subjective:CT scan showed a contained rupture of infrarenal abdominal aorta measuring A 0.3 x 9 cm as well as extending to a hematoma in the anterior left abdominal. S/p AAA repair yesterday. On Cardizem drip. Patient is demented, no complaint.  Review of Systems:    CONSTITUTIONAL: No documented fever. No fatigue, weakness. No weight gain, no weight loss.  EYES: No blurry or double vision.  ENT: No tinnitus. No postnasal drip. No redness of the oropharynx.  RESPIRATORY: No cough, no wheeze, no hemoptysis. No dyspnea.  CARDIOVASCULAR: No chest pain. No orthopnea. No palpitations. No syncope.  GASTROINTESTINAL: No nausea, no vomiting or diarrhea positive abdominal pain. No melena or hematochezia.  GENITOURINARY:  No urgency. No frequency. No dysuria. No hematuria. No obstructive symptoms. No discharge. No pain. No significant abnormal bleeding ENDOCRINE: No polyuria or nocturia. No heat or cold intolerance.  HEMATOLOGY: No anemia. No bruising. No bleeding. No purpura. No petechiae INTEGUMENTARY: No rashes. No lesions.  MUSCULOSKELETAL: No arthritis. No swelling. No gout.  NEUROLOGIC: No numbness, tingling, or ataxia. No seizure-type activity.  PSYCHIATRIC: No anxiety. No insomnia. No ADD.     Vitals:   Filed Vitals:   05/20/16 0800 05/20/16 0900 05/20/16 1000 05/20/16 1129  BP: 119/53 115/50 115/55 144/47  Pulse:  97 45 47  Temp:      TempSrc:      Resp: 19 22 20 20   Height:      Weight:      SpO2:  97% 100% 100% 90%    Wt Readings from Last 3 Encounters:  2016/05/31 166 lb 7.2 oz (75.5 kg)     Intake/Output Summary (Last 24 hours) at 05/20/16 1258 Last data filed at 05/20/16 0900  Gross per 24 hour  Intake 2270.38 ml  Output   1455 ml  Net 815.38 ml    Physical Exam:   GENERAL: Critically ill-appearing HEAD, EYES, EARS, NOSE AND THROAT: Atraumatic, normocephalic. Pale conjunctiva. Pupils equal and reactive to light. Sclerae anicteric.  No oro-pharyngeal erythema.  NECK: Supple. There is no jugular venous distention. No bruits, no lymphadenopathy,  no thyromegaly.  HEART: Irregularly irregular. No murmurs, no rubs, no clicks.  LUNGS: Clear to auscultation bilaterally. No rales or rhonchi. No wheezes.  ABDOMEN: Soft, flat, no tenderness, nondistended. Has good bowel sounds. No hepatosplenomegaly appreciated.  EXTREMITIES: No evidence of any cyanosis, clubbing, or peripheral edema.  +2 pedal and radial pulses bilaterally.  NEUROLOGIC: demented. SKIN: Moist and warm with no rashes appreciated.  Psych: Not anxious, depressed LN: No inguinal LN enlargement    Antibiotics   Anti-infectives    Start     Dose/Rate Route Frequency Ordered Stop   05/17/16 0500  vancomycin (VANCOCIN) IVPB 1000 mg/200 mL premix  Status:  Discontinued     1,000 mg 200 mL/hr over 60 Minutes Intravenous Every 24 hours 05/16/16 1156 05/16/16 1426   05/16/16 0300  vancomycin (VANCOCIN) 1,250 mg in sodium chloride 0.9 % 250 mL IVPB  Status:  Discontinued     1,250 mg 166.7 mL/hr over 90 Minutes Intravenous Every 36 hours 05/16/16 0048 05/16/16 1156   05/24/16 1800  vancomycin (VANCOCIN) IVPB 1000 mg/200 mL premix     1,000 mg 200 mL/hr over 60 Minutes Intravenous  Once May 24, 2016 1717 May 24, 2016 2030   2016-05-24 1800  piperacillin-tazobactam (ZOSYN) IVPB 3.375 g     3.375 g 12.5 mL/hr over 240 Minutes Intravenous Every 8 hours 05-24-2016 1733     May 24, 2016 1730  piperacillin-tazobactam (ZOSYN) IVPB 3.375 g   Status:  Discontinued     3.375 g 100 mL/hr over 30 Minutes Intravenous  Once 05/24/2016 1717 May 24, 2016 1732   05-24-2016 1630  piperacillin-tazobactam (ZOSYN) IVPB 3.375 g  Status:  Discontinued     3.375 g 100 mL/hr over 30 Minutes Intravenous  Once 05-24-16 1559 05/24/16 1733   2016-05-24 1630  vancomycin (VANCOCIN) IVPB 1000 mg/200 mL premix  Status:  Discontinued     1,000 mg 200 mL/hr over 60 Minutes Intravenous  Once 05-24-2016 1602 2016/05/24 1734      Medications   Scheduled Meds: . sodium chloride   Intravenous Once  . sodium chloride   Intravenous Once  . sodium chloride   Intravenous Once  . antiseptic oral rinse  7 mL Mouth Rinse q12n4p  . chlorhexidine  15 mL Mouth Rinse BID  . ondansetron (ZOFRAN) IV  4 mg Intravenous Q6H  . pantoprazole (PROTONIX) IV  40 mg Intravenous Q12H  . piperacillin-tazobactam (ZOSYN)  IV  3.375 g Intravenous Q8H  . sodium chloride flush  3 mL Intravenous Q12H   Continuous Infusions: . sodium chloride 75 mL/hr at 05/20/16 0845  . diltiazem (CARDIZEM) infusion 5 mg/hr (05/20/16 0600)   PRN Meds:.albuterol, morphine injection, ondansetron **OR** [DISCONTINUED] ondansetron (ZOFRAN) IV   Data Review:   Micro Results Recent Results (from the past 240 hour(s))  Urine culture     Status: None   Collection Time: May 24, 2016  4:00 PM  Result Value Ref Range Status   Specimen Description URINE, RANDOM  Final   Special Requests NONE  Final   Culture NO GROWTH Performed at [facility]   Final   Report Status 05/16/2016 FINAL  Final  MRSA PCR Screening     Status: None   Collection Time: 2016-05-24  5:51 PM  Result Value Ref Range Status   MRSA by PCR NEGATIVE NEGATIVE Final    Comment:        The GeneXpert MRSA Assay (FDA approved for NASAL specimens only), is one component of a comprehensive MRSA colonization surveillance program. It is not intended to diagnose  MRSA infection nor to guide or monitor treatment for MRSA infections.    Culture, blood (x 2)     Status: None   Collection Time: 05/21/2016  5:52 PM  Result Value Ref Range Status   Specimen Description BLOOD LEFT AC  Final   Special Requests   Final    BOTTLES DRAWN AEROBIC AND ANAEROBIC  ANA , AER 2 ML   Culture NO GROWTH 5 DAYS  Final   Report Status 05/20/2016 FINAL  Final  Culture, blood (x 2)     Status: None   Collection Time: 05/13/2016  7:17 PM  Result Value Ref Range Status   Specimen Description BLOOD LEFT WRIST  Final   Special Requests   Final    BOTTLES DRAWN AEROBIC AND ANAEROBIC AEROBIC 15CC, ANAEROBIC 10CC   Culture NO GROWTH 5 DAYS  Final   Report Status 05/20/2016 FINAL  Final  Urine culture     Status: None   Collection Time: 05/18/2016 10:06 PM  Result Value Ref Range Status   Specimen Description URINE, CLEAN CATCH  Final   Special Requests Normal  Final   Culture NO GROWTH Performed at [facility]   Final   Report Status 05/18/2016 FINAL  Final    Radiology Reports Ct Abdomen Pelvis Wo Contrast  05/18/2016  CLINICAL DATA:  Abdomen pain.  Upper GI bleeding. EXAM: CT ABDOMEN AND PELVIS WITHOUT CONTRAST TECHNIQUE: Multidetector CT imaging of the abdomen and pelvis was performed following the standard protocol without IV contrast. COMPARISON:  January 20, 2015 FINDINGS: Lower chest: The heart has is enlarged. There are small bilateral pleural effusions. Patchy atelectasis is identified in bilateral lung bases. Hepatobiliary: The liver and gallbladder are normal. No mass visualized on this un-enhanced exam. Pancreas: No mass or inflammatory process identified on this un-enhanced exam. Spleen: Within normal limits in size. Adrenals/Urinary Tract: The adrenal glands are normal. Right renal vascular calcification is noted. No evidence of urolithiasis or hydronephrosis. No definite mass visualized on this un-enhanced exam. Evaluation of bladder is limited due to metallic artifact from bilateral hip replacements. Stomach/Bowel: PEG tube is  identified. There is a broad-based anterior abdominal and pelvic herniation of stomach, bowel loops and mesenteric fat. There are few mildly dilated small bowel bowel loops in the anterior pelvis. There is mild diffuse bowel wall thickening of the sigmoid colon. Vascular/Lymphatic: There is contained rupture of the infrarenal abdominal aorta measuring 8.3 x 9 cm. There is fluid/ blood tracking from the inferior portion of the contained abdominal aortic rupture extending to a hematoma in the anterior left abdomen measuring 8.9 x 5.4 cm. No pathologically enlarged lymph nodes. Reproductive: Evaluation is limited due to metallic artifact from bilateral hip replacements. Other: None. Musculoskeletal: Extensive degenerative joint changes are identified in the spine. Bilateral hip replacements are noted. IMPRESSION: contained rupture of the infrarenal abdominal aorta measuring 8.3 x 9 cm. There is fluid/ blood tracking from the inferior portion of the contained abdominal aortic rupture extending to a hematoma in the anterior left abdomen measuring 8.9 x 5.4 cm. Mildly dilated small bowel loops in the pelvis with mild diffuse thick wall sigmoid colon. Critical Value/emergent results were called by telephone at the time of interpretation on 05/18/2016 at 4:22 pm to Dr. Belia Heman, who verbally acknowledged these results. Electronically Signed   By: Sherian Rein M.D.   On: 05/18/2016 16:22   Dg Abd 1 View  05/16/2016  CLINICAL DATA:  Abdominal pain EXAM: ABDOMEN - 1 VIEW COMPARISON:  06/09/2016 FINDINGS: Gastrostomy tube projects over the left abdomen. Nonobstructive bowel gas pattern. No free air organomegaly. No acute bony abnormality. Bilateral hip replacements noted. IMPRESSION: No acute findings. Electronically Signed   By: Charlett Nose M.D.   On: 05/16/2016 07:21   Dg Abd 1 View  06/09/2016  CLINICAL DATA:  Pain, vomiting. EXAM: ABDOMEN - 1 VIEW COMPARISON:  None FINDINGS: Nonspecific bowel gas pattern. Mild dilatation of  bowel in the right pelvis which is likely the cecum filled with stool. No convincing evidence for bowel obstruction. No organomegaly or free air. Gastrostomy tube projects over the stomach. Visualized lung bases are clear. IMPRESSION: Nonspecific bowel gas pattern without evidence for obstruction. Large stool burden in the right colon. Electronically Signed   By: Charlett Nose M.D.   On: 05/30/2016 13:53   Dg Chest Portable 1 View  05/28/2016  CLINICAL DATA:  Pain, vomiting. EXAM: PORTABLE CHEST 1 VIEW COMPARISON:  None. FINDINGS: Mild cardiomegaly. Minimal left base atelectasis. Right lung is clear. No effusions. Heart is normal size. IMPRESSION: Left base atelectasis.  Mild cardiomegaly. Electronically Signed   By: Charlett Nose M.D.   On: 05/21/2016 13:53     CBC  Recent Labs Lab 05/17/2016 1306  05/16/16 0539 05/16/16 1326 05/17/16 0436 05/18/16 0504 15-Jun-2016 05/20/16 0731  WBC 29.6*  --  27.2* 24.2* 16.5* 17.5* 18.7* 17.2*  HGB 10.3*  < > 8.2* 9.2* 8.0* 10.0* 9.5* 8.6*  HCT 32.4*  --  25.3* 28.5* 25.1* 30.8* 29.4* 26.5*  PLT 251  --  172 160 152 159 153 102*  MCV 87.0  --  88.0 87.9 87.9 84.7 86.3 85.5  MCH 27.7  --  28.6 28.3 28.1 27.6 27.7 27.8  MCHC 31.8*  --  32.5 32.2 32.0 32.6 32.1 32.5  RDW 18.3*  --  17.7* 17.6* 17.8* 18.8* 18.8* 18.3*  LYMPHSABS 0.8*  --  0.7*  --  0.3* 0.5* 0.4*  --   MONOABS 1.4*  --  0.7  --  0.6 0.7 0.5  --   EOSABS 0.0  --  0.0  --  0.0 0.0 0.1  --   BASOSABS 0.1  --  0.0  --  0.0 0.0 0.0  --   < > = values in this interval not displayed.  Chemistries   Recent Labs Lab 06/05/2016 1306  05/16/16 0539 05/17/16 0436 05/18/16 0504 05/18/16 1258 05/18/16 1859 June 15, 2016 06-15-16 2220 05/20/16 0731  NA 135  --  138 144 146*  --   --  147* 145 145  K 4.2  --  4.0 3.7 2.9* 4.6 3.1* 3.7 3.3* 3.9  CL 96*  --  111 119* 120*  --   --  122* 121* 120*  CO2 23  --  20* 19* 19*  --   --  18* 18* 18*  GLUCOSE 171*  --  129* 134* 100*  --   --  75 107* 102*   BUN 53*  --  48* 37* 28*  --   --  25* 21* 20  CREATININE 1.43*  --  1.15* 1.06* 0.91  --   --  0.86 0.78 0.91  CALCIUM 9.0  --  7.1* 7.1* 7.8*  --   --  7.9* 7.5* 7.5*  MG  --   < > 2.1 2.1 2.0  --   --  1.8 1.5* 2.5*  AST 30  --  32  --   --   --   --   --   --   --  ALT 17  --  16  --   --   --   --   --   --   --   ALKPHOS 74  --  54  --   --   --   --   --   --   --   BILITOT 0.8  --  1.2  --   --   --   --   --   --   --   < > = values in this interval not displayed. ------------------------------------------------------------------------------------------------------------------ estimated creatinine clearance is 44.4 mL/min (by C-G formula based on Cr of 0.91). ------------------------------------------------------------------------------------------------------------------ No results for input(s): HGBA1C in the last 72 hours. ------------------------------------------------------------------------------------------------------------------ No results for input(s): CHOL, HDL, LDLCALC, TRIG, CHOLHDL, LDLDIRECT in the last 72 hours. ------------------------------------------------------------------------------------------------------------------ No results for input(s): TSH, T4TOTAL, T3FREE, THYROIDAB in the last 72 hours.  Invalid input(s): FREET3 ------------------------------------------------------------------------------------------------------------------ No results for input(s): VITAMINB12, FOLATE, FERRITIN, TIBC, IRON, RETICCTPCT in the last 72 hours.  Coagulation profile  Recent Labs Lab 06/08/2016 1306 05/16/16 0539  INR 1.16 1.23    No results for input(s): DDIMER in the last 72 hours.  Cardiac Enzymes  Recent Labs Lab 05/23/2016 1752 05/13/2016 2325 05/16/16 0539  TROPONINI 0.29* 0.28* 0.19*   ------------------------------------------------------------------------------------------------------------------ Invalid input(s): POCBNP    Assessment & Plan    Patient is a 82 year old African-American female presenting with upper GI bleed  1. AAA with rupture- s/p Placement of a Gore Excluder endoprosthesis. Can resume tube feeding per Dr. Gilda Crease.  2. UPPER GI bleeding and anemia due to acute blood loss Could be due to gastritis or ulcer, but not a candidate for EGD this time per Dr. Markham Jordan. Hemoglobin is currently stable drifted down little bit. PRBC transfusion prn.   3. A flutter with RVR,  May discontinue  IV Cardizem and change to GT.  4. Sepsis and hypotension Suspect due to abdominal aortic aneurysm rupture, on Zosyn.  Hypotension improved, still leukocytosis. F/u CBC.  5. Acute renal failure  improved  6. Elevated troponin due to demand ischemia  7. Severe hypokalemia replaced and improved.  Hypomagnesemia. Replaced and improved.  I discussed with Dr. Gilda Crease and Dr. Markham Jordan.  8. Code DO NOT RESUSCITATE      Code Status Orders        Start     Ordered   05/24/2016 1718  Do not attempt resuscitation (DNR)   Continuous    Question Answer Comment  In the event of cardiac or respiratory ARREST Do not call a "code blue"   In the event of cardiac or respiratory ARREST Do not perform Intubation, CPR, defibrillation or ACLS   In the event of cardiac or respiratory ARREST Use medication by any route, position, wound care, and other measures to relive pain and suffering. May use oxygen, suction and manual treatment of airway obstruction as needed for comfort.      06/04/2016 1717    Code Status History    Date Active Date Inactive Code Status Order ID Comments User Context   This patient has a current code status but no historical code status.    Advance Directive Documentation        Most Recent Value   Type of Advance Directive  Out of facility DNR (pink MOST or yellow form)   Pre-existing out of facility DNR order (yellow form or pink MOST form)  Yellow form placed in chart (order not valid for inpatient use)   "MOST"  Form in Place?  Consults GI, CArdiology  DVT ProphylaxisSCDs  Lab Results  Component Value Date   PLT 102* 05/20/2016     Time Spent in minutes  46 min critical care time spent   Greater than 50% of time spent in care coordination and counseling patient regarding the condition and plan of care.   Shaune Pollack M.D on 05/20/2016 at 12:58 PM  Between 7am to 6pm - Pager - [phone]  After 6pm go to [email or web address] - password [email or web address] [facility]  [facility]   Office  [phone]

## 2016-05-20 NOTE — Progress Notes (Signed)
[facility]  Daily Progress Note   Subjective: 1 Day Post-Op: US guidance for vascular access, bilateral femoral arteries, Catheter placement into aorta from bilateral femoral approaches, Placement of a C3 Gore Excluder Endoprosthesis main body 28 x 14 x 12 with a 16 x 12 contralateral limb, Placement of a Gore Excluder endoprosthesis 18 x 10 iliac extension right iliac system, Placement of a Gore Excluder endoprosthesis 28 aortic cuff proximally with ProGlide closure devices bilateral femoral arteries  Patient without complaint. Denies any pain.   Objective: Filed Vitals:   05/20/16 0800 05/20/16 0900 05/20/16 1000 05/20/16 1129  BP: 119/53 115/50 115/55 144/47  Pulse:  97 45 47  Temp:      TempSrc:      Resp: 19 22 20 20   Height:      Weight:      SpO2: 97% 100% 100% 90%    Intake/Output Summary (Last 24 hours) at 05/20/16 1507 Last data filed at 05/20/16 1449  Gross per 24 hour  Intake 1638.88 ml  Output   1150 ml  Net 488.88 ml    Physical Exam: A&Ox3, NAD CV: RRR Pulmonary: CTA Bilaterally Abdomen: Soft, Nontender, Nondistended - gastrosotomy noted.  Bilateral Groins: Dermabond intact, no swelling or drainage Vascular: warm, non-tender, moderate edema   Laboratory: CBC    Component Value Date/Time   WBC 17.2* 05/20/2016 0731   WBC 13.0* 03/29/2015 1115   HGB 8.6* 05/20/2016 0731   HGB 13.1 03/29/2015 1115   HCT 26.5* 05/20/2016 0731   HCT 40.9 03/29/2015 1115   PLT 102* 05/20/2016 0731   PLT 290 03/29/2015 1115   BMET    Component Value Date/Time   NA 145 05/20/2016 0731   NA 132* 03/28/2015 1130   K 3.9 05/20/2016 0731   K 3.9 03/28/2015 1130   CL 120* 05/20/2016 0731   CL 100* 03/28/2015 1130   CO2 18* 05/20/2016 0731   CO2 25 03/28/2015 1130   GLUCOSE 102* 05/20/2016 0731   GLUCOSE 137* 03/28/2015 1130   BUN 20 05/20/2016 0731   BUN 17 03/28/2015 1130   CREATININE 0.91 05/20/2016 0731   CREATININE 0.77 03/28/2015 1130   CALCIUM 7.5* 05/20/2016 0731   CALCIUM 9.2 03/28/2015 1130   GFRNONAA 55* 05/20/2016 0731   GFRNONAA >60 03/28/2015 1130   GFRNONAA 42* 01/20/2015 0745   GFRAA >60 05/20/2016 0731   GFRAA >60 03/28/2015 1130   GFRAA 51* 01/20/2015 0745   Assessment/Planning: 82 year old female s/p endovascular AAA repair - Stable 1) Physical Therapy 2) Ambulation 3) Care as per primary team  [facility] PA-C 05/20/2016 3:07 PM

## 2016-05-21 ENCOUNTER — Inpatient Hospital Stay: Payer: Medicare Other

## 2016-05-21 DIAGNOSIS — I713 Abdominal aortic aneurysm, ruptured: Principal | ICD-10-CM

## 2016-05-21 DIAGNOSIS — Z87820 Personal history of traumatic brain injury: Secondary | ICD-10-CM

## 2016-05-21 DIAGNOSIS — R140 Abdominal distension (gaseous): Secondary | ICD-10-CM | POA: Insufficient documentation

## 2016-05-21 DIAGNOSIS — N390 Urinary tract infection, site not specified: Secondary | ICD-10-CM

## 2016-05-21 DIAGNOSIS — Z66 Do not resuscitate: Secondary | ICD-10-CM

## 2016-05-21 DIAGNOSIS — R112 Nausea with vomiting, unspecified: Secondary | ICD-10-CM

## 2016-05-21 DIAGNOSIS — J449 Chronic obstructive pulmonary disease, unspecified: Secondary | ICD-10-CM

## 2016-05-21 DIAGNOSIS — R109 Unspecified abdominal pain: Secondary | ICD-10-CM | POA: Insufficient documentation

## 2016-05-21 DIAGNOSIS — I509 Heart failure, unspecified: Secondary | ICD-10-CM

## 2016-05-21 DIAGNOSIS — F0390 Unspecified dementia without behavioral disturbance: Secondary | ICD-10-CM

## 2016-05-21 DIAGNOSIS — Z515 Encounter for palliative care: Secondary | ICD-10-CM

## 2016-05-21 DIAGNOSIS — M069 Rheumatoid arthritis, unspecified: Secondary | ICD-10-CM

## 2016-05-21 DIAGNOSIS — Z79899 Other long term (current) drug therapy: Secondary | ICD-10-CM

## 2016-05-21 DIAGNOSIS — I10 Essential (primary) hypertension: Secondary | ICD-10-CM

## 2016-05-21 LAB — MAGNESIUM: Magnesium: 1.8 mg/dL (ref 1.7–2.4)

## 2016-05-21 LAB — BASIC METABOLIC PANEL
Anion gap: 6 (ref 5–15)
Anion gap: 4 — ABNORMAL LOW (ref 5–15)
BUN: 19 mg/dL (ref 6–20)
BUN: 18 mg/dL (ref 6–20)
CALCIUM: 7.3 mg/dL — AB (ref 8.9–10.3)
CALCIUM: 7.4 mg/dL — AB (ref 8.9–10.3)
CO2: 18 mmol/L — ABNORMAL LOW (ref 22–32)
CO2: 20 mmol/L — AB (ref 22–32)
CREATININE: 0.88 mg/dL (ref 0.44–1.00)
Chloride: 122 mmol/L — ABNORMAL HIGH (ref 101–111)
Chloride: 121 mmol/L — ABNORMAL HIGH (ref 101–111)
Creatinine, Ser: 0.95 mg/dL (ref 0.44–1.00)
GFR calc Af Amer: >60 mL/min (ref >60)
GFR calc non Af Amer: 57 mL/min — ABNORMAL LOW (ref >60)
GFR, EST NON AFRICAN AMERICAN: 52 mL/min — AB (ref >60)
GLUCOSE: 189 mg/dL — AB (ref 65–99)
Glucose, Bld: 241 mg/dL — ABNORMAL HIGH (ref 65–99)
Potassium: 3.3 mmol/L — ABNORMAL LOW (ref 3.5–5.1)
Potassium: 4.1 mmol/L (ref 3.5–5.1)
SODIUM: 146 mmol/L — AB (ref 135–145)
Sodium: 145 mmol/L (ref 135–145)

## 2016-05-21 LAB — CBC
HCT: 26.5 % — ABNORMAL LOW (ref 35.0–47.0)
HEMOGLOBIN: 8.5 g/dL — AB (ref 12.0–16.0)
MCH: 27 pg (ref 26.0–34.0)
MCHC: 32.2 g/dL (ref 32.0–36.0)
MCV: 84.1 fL (ref 80.0–100.0)
Platelets: 101 10*3/uL — ABNORMAL LOW (ref 150–440)
RBC: 3.15 MIL/uL — ABNORMAL LOW (ref 3.80–5.20)
RDW: 18.4 % — AB (ref 11.5–14.5)
WBC: 17.6 10*3/uL — ABNORMAL HIGH (ref 3.6–11.0)

## 2016-05-21 LAB — TYPE AND SCREEN
ABO/RH(D): O POS
Antibody Screen: NEGATIVE
UNIT DIVISION: 0
Unit division: 0

## 2016-05-21 LAB — PREALBUMIN: Prealbumin: 3.9 mg/dL — ABNORMAL LOW (ref 18–38)

## 2016-05-21 LAB — PREPARE RBC (CROSSMATCH)

## 2016-05-21 LAB — PHOSPHORUS: Phosphorus: 1.8 mg/dL — ABNORMAL LOW (ref 2.5–4.6)

## 2016-05-21 MED ORDER — POTASSIUM PHOSPHATES 15 MMOLE/5ML IV SOLN
24.0000 mmol | Freq: Once | INTRAVENOUS | Status: AC
  Administered 2016-05-21: 24 mmol via INTRAVENOUS
  Filled 2016-05-21: qty 8

## 2016-05-21 MED ORDER — POTASSIUM CHLORIDE 10 MEQ/100ML IV SOLN
10.0000 meq | INTRAVENOUS | Status: AC
  Administered 2016-05-21 (×2): 10 meq via INTRAVENOUS
  Filled 2016-05-21 (×2): qty 100

## 2016-05-21 MED ORDER — DILTIAZEM HCL 60 MG PO TABS
60.0000 mg | ORAL_TABLET | Freq: Three times a day (TID) | ORAL | Status: DC
  Administered 2016-05-21 – 2016-05-22 (×3): 60 mg via ORAL
  Filled 2016-05-21 (×3): qty 1

## 2016-05-21 MED ORDER — DILTIAZEM HCL 25 MG/5ML IV SOLN
5.0000 mg | Freq: Four times a day (QID) | INTRAVENOUS | Status: DC | PRN
  Administered 2016-05-22 (×2): 5 mg via INTRAVENOUS
  Filled 2016-05-21 (×2): qty 5

## 2016-05-21 NOTE — Care Management (Signed)
Barrier to discharge- vomiting and tube feedings now on hold.  Abd distended.  Palliative care consult is pending- reached out to inform that patient has been transferred out of icu to 2A.

## 2016-05-21 NOTE — Progress Notes (Addendum)
Nutrition Follow-up  DOCUMENTATION CODES:   Non-severe (moderate) malnutrition in context of chronic illness  INTERVENTION:  -TF on hold due to emesis and abdominal distention, general surgery consulted, abdominal xray with possible ileus vs bowel obstruction. Agree with holding TF at this time. Of note, pt with hx of recurrent vomiting for at least 6 months, worsening recently. Pt has been having difficulty tolerating TF at nursing home and TF has been held frequently per Maralyn Sago RD at [facility]; TF formula changed with initiation of TF yesterday, plan was to start TF at rate of 20 ml/hr but TF started at goal rate per documentation. Will follow and continue to assess  NUTRITION DIAGNOSIS:   Inadequate oral intake related to acute illness as evidenced by NPO status.  GOAL:   Patient will meet greater than or equal to 90% of their needs  MONITOR:   Labs, I & O's, Weight trends (Ability to restart TF)  REASON FOR ASSESSMENT:   Consult Assessment of nutrition requirement/status  ASSESSMENT:   82 yo female admitted after vomiting blood with GI bleed and anemia due to acute blood loss. . Pt with hx of multiple abdominal surgeries, bowel obstruction; per chart review, pt with J-tube placed in 2013 but currently with G-tube. Pt receives TF via G-tube and nothing by mouth. Per report, pt with significant vomiting for last 1-2 weeks but has been vomiting multiple times a week for the past 6 months. Pt with severe dementia at baseline.   TF on hold due to yellow emesis and abdominal distention. FL diet order discontinued. Surgery has been consulted for possible ileus vs SBO  Vital 1.5 TF started at rate of 45 ml/hr yesterday despite order to start at 20 ml/hr  Diet Order:  Diet NPO time specified  Skin:   (deep tissue injury to heel)  Last BM:  05/20/16 liquid stool via rectal tube   Labs: sodium 146, potassium 3.3 (supplemented), phosphorus 1.8 (supplemented), magnesium wdl,  prealbumin 3.9  Meds: NS at 50 ml/hr (rate decreased)  Height:   Ht Readings from Last 1 Encounters:  June 10, 2016 5\' 6"  (1.676 m)    Weight:   Wt Readings from Last 1 Encounters:  2016-06-10 166 lb 7.2 oz (75.5 kg)    Ideal Body Weight:     BMI:  Body mass index is 26.88 kg/(m^2).  Estimated Nutritional Needs:   Kcal:  1600-1800 kcals  Protein:  70-88 g  Fluid:  >/= 1.6 L  EDUCATION NEEDS:   No education needs identified at this time  07/15/16 MS, RD, LDN [phone] Pager  [phone] Weekend/On-Call Pager

## 2016-05-21 NOTE — Progress Notes (Signed)
Palliative Care Update  I had seen pt to do full consult on 6/6 just at the time the CT scan came back showing a ruptured AAA.  I had introduced myself to family member but said I would only follow at a distance until we could learn what would come of this emergent matter.  Dr Gilda Crease, vascular surgeon, presented a palliatve approach vs surgery and family opted for surgery with stent graft.   She had the surgery on 6/7.   Since that time, pt has moved to the floor but is having other problems.   I will initiate a full consult today. This note is to update the care team as to the status of the Palliative Care consult.    Suan Halter, MD

## 2016-05-21 NOTE — Progress Notes (Signed)
CSW spoke to patient's daughter Wilnette Kales and introduced herself. Informed her that she's aware that patient is from [facility] and explained CSW's role. CSW will continue to follow and assist.  Woodroe Mode, MSW, LCSW-A Clinical Social Work Department [phone]

## 2016-05-21 NOTE — Progress Notes (Signed)
Dr. Imogene Burn notified that patient abd is distended and has yellow emesis. Tube feedings were stopped earlier this am d/t this. Ordered to also make patient NPO and keep the feedings paused. Bowel sounds hypoactive, Dr. Imogene Burn also aware of this.  Amanda Mcdowell

## 2016-05-21 NOTE — Care Management Important Message (Signed)
Important Message  Patient Details  Name: Isma Tietje MRN: [id number] Date of Birth: 09/12/1926   Medicare Important Message Given:  Yes    Eber Hong, RN 05/21/2016, 8:59 AM

## 2016-05-21 NOTE — Progress Notes (Signed)
PT Cancellation Note  Patient Details Name: Amanda Mcdowell MRN: [id number] DOB: 1926-09-16   Cancelled Treatment:    Reason Eval/Treat Not Completed: Other (comment). Consult received and chart reviewed. Pt with elevated HR (146), BP (158/104) and temp 100.4. Consulted with RN, who wishes to hold therapy intervention at this time. Will re-attempt at another time.   Roseanna Koplin 05/21/2016, 9:05 AM Elizabeth Palau, PT, DPT [phone]

## 2016-05-21 NOTE — Progress Notes (Signed)
Dr. Orvan Falconer notified of consult placed several days ago, stated that she would be by later today to speak with family. Trudee Kuster

## 2016-05-21 NOTE — Progress Notes (Signed)
Pharmacy Consult for Electrolyte Monitoring Indication: Hypokalemia  Allergies  Allergen Reactions  . Atenolol Other (See Comments)    Unknown reaction.   Patient Measurements: Height: 5\' 6"  (167.6 cm) Weight: 166 lb 7.2 oz (75.5 kg) IBW/kg (Calculated) : 59.3  Vital Signs: Temp: 98.4 F (36.9 C) (06/09 1151) Temp Source: Oral (06/09 1151) BP: 153/79 mmHg (06/09 1433) Pulse Rate: 110 (06/09 1433) Intake/Output from previous day: 06/08 0701 - 06/09 0700 In: 3140 [P.O.:45; I.V.:1683.5; NG/GT:931.5; IV Piggyback:350] Out: 900 [Urine:500; Stool:400] Intake/Output from this shift: Total I/O In: -  Out: 400 [Urine:400]  Labs:  Recent Labs  05/24/2016 05/20/16 0731 05/21/16 0518  WBC 18.7* 17.2* 17.6*  HGB 9.5* 8.6* 8.5*  HCT 29.4* 26.5* 26.5*  PLT 153 102* 101*     Recent Labs  05/26/2016 2220 05/20/16 0731 05/21/16 0518 05/21/16 1614  NA 145 145 146* 145  K 3.3* 3.9 3.3* 4.1  CL 121* 120* 122* 121*  CO2 18* 18* 18* 20*  GLUCOSE 107* 102* 241* 189*  BUN 21* 20 19 18   CREATININE 0.78 0.91 0.88 0.95  CALCIUM 7.5* 7.5* 7.3* 7.4*  MG 1.5* 2.5* 1.8  --   PHOS 2.7 2.5 1.8*  --   PREALBUMIN  --   --  3.9*  --    Estimated Creatinine Clearance: 42.5 mL/min (by C-G formula based on Cr of 0.95).    Recent Labs  05/20/16 2129  GLUCAP 183*    Medical History: Past Medical History  Diagnosis Date  . HTN (hypertension)   . AV block, 1st degree   . Atrial flutter (HCC)   . COPD (chronic obstructive pulmonary disease) (HCC)   . RA (rheumatoid arthritis) (HCC)   . Anemia   . Hyperlipidemia   . Heart failure (HCC)   . UTI (lower urinary tract infection)   . SSS (sick sinus syndrome) (HCC)   . Dementia   . Depression   . Appendicitis     Medications:  Scheduled:  . sodium chloride   Intravenous Once  . sodium chloride   Intravenous Once  . sodium chloride   Intravenous Once  . antiseptic oral rinse  7 mL Mouth Rinse q12n4p  . chlorhexidine  15 mL Mouth  Rinse BID  . diltiazem  60 mg Oral Q8H  . free water  100 mL Per Tube Q4H  . ondansetron (ZOFRAN) IV  4 mg Intravenous Q6H  . pantoprazole (PROTONIX) IV  40 mg Intravenous Q12H  . piperacillin-tazobactam (ZOSYN)  IV  3.375 g Intravenous Q8H  . sodium chloride flush  3 mL Intravenous Q12H   Infusions:  . sodium chloride 50 mL/hr at 05/21/16 1009  . feeding supplement (VITAL 1.5 CAL) 1,000 mL (05/20/16 1638)    Assessment: Pharmacy consulted to assist in managing and replacing electrolytes in this 82 y/o F admitted with GIB.   Plan:  K 3.3, phos 1.8, Mg 1.8. Give potassium phosphate 24 mmol IV x 1 and potassium chloride 10 mEq IV Q1H x 2 doses. Will recheck electrolytes this afternoon.  6/9 16:00 K=4.1  98, Pharm.D., BCPS Clinical Pharmacist 05/21/2016,5:01 PM

## 2016-05-21 NOTE — Progress Notes (Signed)
[facility] [place] [facility]                                                                                                                                                                                            Patient Demographics   Amanda Mcdowell, is a 82 y.o. female, DOB - 1926-01-08, EHM:[id number]  Admit date - 06/01/2016   Admitting Physician Shaune Pollack, MD  Outpatient Primary MD for the patient is No primary care provider on file.   LOS - 6  Subjective:CT scan showed a contained rupture of infrarenal abdominal aorta measuring A 0.3 x 9 cm as well as extending to a hematoma in the anterior left abdominal. S/p AAA repair. Off Cardizem drip. Patient is demented and lethargy, abd pain and distension. Tube feeding was discontinued this am. Review of Systems:    Unable to get ROS.    Vitals:   Filed Vitals:   05/20/16 1918 05/21/16 0515 05/21/16 1151 05/21/16 1433  BP: 114/80 158/104 150/93 153/79  Pulse: 90 146 133 110  Temp: 98.6 F (37 C) 100.4 F (38 C) 98.4 F (36.9 C)   TempSrc: Oral Oral Oral   Resp: 16 16 20    Height:      Weight:      SpO2: 99% 98% 100%     Wt Readings from Last 3 Encounters:  05/27/2016 166 lb 7.2 oz (75.5 kg)     Intake/Output Summary (Last 24 hours) at 05/21/16 1521 Last data filed at 05/21/16 1446  Gross per 24 hour  Intake 2911.5 ml  Output   1150 ml  Net 1761.5 ml    Physical Exam:   GENERAL: Critically ill-appearing HEAD, EYES, EARS, NOSE AND THROAT: Atraumatic, normocephalic. Pale conjunctiva. Pupils equal and reactive to light. Sclerae anicteric.  NECK: Supple. There is no jugular venous distention. No bruits, no lymphadenopathy, no thyromegaly.  HEART: Irregularly irregular. No murmurs, no rubs, no clicks.  LUNGS: Clear to auscultation bilaterally. No rales or rhonchi. No wheezes.  ABDOMEN: Soft, flat, no tenderness, but distended. Has good bowel sounds. Unable to estimate  hepatosplenomegaly.   EXTREMITIES: No evidence of any cyanosis, clubbing, or peripheral edema.  +2 pedal and radial pulses bilaterally.  NEUROLOGIC: demented. SKIN: Moist and warm with no rashes appreciated.  Psych: Not anxious, depressed LN: No inguinal LN enlargement    Antibiotics   Anti-infectives    Start     Dose/Rate Route Frequency Ordered Stop   05/17/16 0500  vancomycin (VANCOCIN) IVPB 1000 mg/200 mL premix  Status:  Discontinued     1,000 mg 200 mL/hr over 60 Minutes  Intravenous Every 24 hours 05/16/16 1156 05/16/16 1426   05/16/16 0300  vancomycin (VANCOCIN) 1,250 mg in sodium chloride 0.9 % 250 mL IVPB  Status:  Discontinued     1,250 mg 166.7 mL/hr over 90 Minutes Intravenous Every 36 hours 05/16/16 0048 05/16/16 1156   05/23/2016 1800  vancomycin (VANCOCIN) IVPB 1000 mg/200 mL premix     1,000 mg 200 mL/hr over 60 Minutes Intravenous  Once 05/14/2016 1717 05/31/2016 2030   06/10/2016 1800  piperacillin-tazobactam (ZOSYN) IVPB 3.375 g     3.375 g 12.5 mL/hr over 240 Minutes Intravenous Every 8 hours 05/14/2016 1733     05/24/2016 1730  piperacillin-tazobactam (ZOSYN) IVPB 3.375 g  Status:  Discontinued     3.375 g 100 mL/hr over 30 Minutes Intravenous  Once 05/26/2016 1717 06/01/2016 1732   06/01/2016 1630  piperacillin-tazobactam (ZOSYN) IVPB 3.375 g  Status:  Discontinued     3.375 g 100 mL/hr over 30 Minutes Intravenous  Once 05/14/2016 1559 06/11/2016 1733   06/02/2016 1630  vancomycin (VANCOCIN) IVPB 1000 mg/200 mL premix  Status:  Discontinued     1,000 mg 200 mL/hr over 60 Minutes Intravenous  Once 05/28/2016 1602 05/20/2016 1734      Medications   Scheduled Meds: . sodium chloride   Intravenous Once  . sodium chloride   Intravenous Once  . sodium chloride   Intravenous Once  . antiseptic oral rinse  7 mL Mouth Rinse q12n4p  . chlorhexidine  15 mL Mouth Rinse BID  . diltiazem  60 mg Oral Q8H  . free water  100 mL Per Tube Q4H  . ondansetron (ZOFRAN) IV  4 mg Intravenous Q6H  . pantoprazole  (PROTONIX) IV  40 mg Intravenous Q12H  . piperacillin-tazobactam (ZOSYN)  IV  3.375 g Intravenous Q8H  . potassium phosphate IVPB (mmol)  24 mmol Intravenous Once  . sodium chloride flush  3 mL Intravenous Q12H   Continuous Infusions: . sodium chloride 50 mL/hr at 05/21/16 1009  . feeding supplement (VITAL 1.5 CAL) 1,000 mL (05/20/16 1638)   PRN Meds:.acetaminophen, albuterol, morphine injection, ondansetron **OR** [DISCONTINUED] ondansetron (ZOFRAN) IV, oxyCODONE   Data Review:   Micro Results Recent Results (from the past 240 hour(s))  Urine culture     Status: None   Collection Time: 06/09/2016  4:00 PM  Result Value Ref Range Status   Specimen Description URINE, RANDOM  Final   Special Requests NONE  Final   Culture NO GROWTH Performed at [facility]   Final   Report Status 05/16/2016 FINAL  Final  MRSA PCR Screening     Status: None   Collection Time: 05/13/2016  5:51 PM  Result Value Ref Range Status   MRSA by PCR NEGATIVE NEGATIVE Final    Comment:        The GeneXpert MRSA Assay (FDA approved for NASAL specimens only), is one component of a comprehensive MRSA colonization surveillance program. It is not intended to diagnose MRSA infection nor to guide or monitor treatment for MRSA infections.   Culture, blood (x 2)     Status: None   Collection Time: 06/11/2016  5:52 PM  Result Value Ref Range Status   Specimen Description BLOOD LEFT AC  Final   Special Requests   Final    BOTTLES DRAWN AEROBIC AND ANAEROBIC  ANA , AER 2 ML   Culture NO GROWTH 5 DAYS  Final   Report Status 05/20/2016 FINAL  Final  Culture, blood (x 2)  Status: None   Collection Time: 06/05/2016  7:17 PM  Result Value Ref Range Status   Specimen Description BLOOD LEFT WRIST  Final   Special Requests   Final    BOTTLES DRAWN AEROBIC AND ANAEROBIC AEROBIC 15CC, ANAEROBIC 10CC   Culture NO GROWTH 5 DAYS  Final   Report Status 05/20/2016 FINAL  Final  Urine culture     Status: None    Collection Time: 05/21/2016 10:06 PM  Result Value Ref Range Status   Specimen Description URINE, CLEAN CATCH  Final   Special Requests Normal  Final   Culture NO GROWTH Performed at [facility]   Final   Report Status 05/18/2016 FINAL  Final    Radiology Reports Ct Abdomen Pelvis Wo Contrast  05/18/2016  CLINICAL DATA:  Abdomen pain.  Upper GI bleeding. EXAM: CT ABDOMEN AND PELVIS WITHOUT CONTRAST TECHNIQUE: Multidetector CT imaging of the abdomen and pelvis was performed following the standard protocol without IV contrast. COMPARISON:  January 20, 2015 FINDINGS: Lower chest: The heart has is enlarged. There are small bilateral pleural effusions. Patchy atelectasis is identified in bilateral lung bases. Hepatobiliary: The liver and gallbladder are normal. No mass visualized on this un-enhanced exam. Pancreas: No mass or inflammatory process identified on this un-enhanced exam. Spleen: Within normal limits in size. Adrenals/Urinary Tract: The adrenal glands are normal. Right renal vascular calcification is noted. No evidence of urolithiasis or hydronephrosis. No definite mass visualized on this un-enhanced exam. Evaluation of bladder is limited due to metallic artifact from bilateral hip replacements. Stomach/Bowel: PEG tube is identified. There is a broad-based anterior abdominal and pelvic herniation of stomach, bowel loops and mesenteric fat. There are few mildly dilated small bowel bowel loops in the anterior pelvis. There is mild diffuse bowel wall thickening of the sigmoid colon. Vascular/Lymphatic: There is contained rupture of the infrarenal abdominal aorta measuring 8.3 x 9 cm. There is fluid/ blood tracking from the inferior portion of the contained abdominal aortic rupture extending to a hematoma in the anterior left abdomen measuring 8.9 x 5.4 cm. No pathologically enlarged lymph nodes. Reproductive: Evaluation is limited due to metallic artifact from bilateral hip replacements. Other:  None. Musculoskeletal: Extensive degenerative joint changes are identified in the spine. Bilateral hip replacements are noted. IMPRESSION: contained rupture of the infrarenal abdominal aorta measuring 8.3 x 9 cm. There is fluid/ blood tracking from the inferior portion of the contained abdominal aortic rupture extending to a hematoma in the anterior left abdomen measuring 8.9 x 5.4 cm. Mildly dilated small bowel loops in the pelvis with mild diffuse thick wall sigmoid colon. Critical Value/emergent results were called by telephone at the time of interpretation on 05/18/2016 at 4:22 pm to Dr. Belia Heman, who verbally acknowledged these results. Electronically Signed   By: Sherian Rein M.D.   On: 05/18/2016 16:22   Dg Abd 1 View  05/21/2016  CLINICAL DATA:  Abdominal distention, vomiting. EXAM: ABDOMEN - 1 VIEW COMPARISON:  CT 05/18/2016 FINDINGS: Gastrostomy tube projects over the left abdomen. There are dilated bowel loops in the abdomen and pelvis, likely a combination of small and large bowel. Favor ileus although small bowel obstruction cannot be completely excluded. No free air organomegaly. Prior stent graft repair of abdominal aortic aneurysm. IMPRESSION: Dilated bowel within the abdomen and pelvis, favor ileus although distal small bowel obstruction cannot be excluded. Electronically Signed   By: Charlett Nose M.D.   On: 05/21/2016 12:57   Dg Abd 1 View  05/16/2016  CLINICAL  DATA:  Abdominal pain EXAM: ABDOMEN - 1 VIEW COMPARISON:  06/09/2016 FINDINGS: Gastrostomy tube projects over the left abdomen. Nonobstructive bowel gas pattern. No free air organomegaly. No acute bony abnormality. Bilateral hip replacements noted. IMPRESSION: No acute findings. Electronically Signed   By: Charlett Nose M.D.   On: 05/16/2016 07:21   Dg Abd 1 View  06/04/2016  CLINICAL DATA:  Pain, vomiting. EXAM: ABDOMEN - 1 VIEW COMPARISON:  None FINDINGS: Nonspecific bowel gas pattern. Mild dilatation of bowel in the right pelvis which is  likely the cecum filled with stool. No convincing evidence for bowel obstruction. No organomegaly or free air. Gastrostomy tube projects over the stomach. Visualized lung bases are clear. IMPRESSION: Nonspecific bowel gas pattern without evidence for obstruction. Large stool burden in the right colon. Electronically Signed   By: Charlett Nose M.D.   On: 06/05/2016 13:53   Dg Chest Portable 1 View  06/07/2016  CLINICAL DATA:  Pain, vomiting. EXAM: PORTABLE CHEST 1 VIEW COMPARISON:  None. FINDINGS: Mild cardiomegaly. Minimal left base atelectasis. Right lung is clear. No effusions. Heart is normal size. IMPRESSION: Left base atelectasis.  Mild cardiomegaly. Electronically Signed   By: Charlett Nose M.D.   On: 06/03/2016 13:53     CBC  Recent Labs Lab 05/31/2016 1306  05/16/16 0539  05/17/16 0436 05/18/16 0504 05/23/2016 05/20/16 0731 05/21/16 0518  WBC 29.6*  --  27.2*  < > 16.5* 17.5* 18.7* 17.2* 17.6*  HGB 10.3*  < > 8.2*  < > 8.0* 10.0* 9.5* 8.6* 8.5*  HCT 32.4*  --  25.3*  < > 25.1* 30.8* 29.4* 26.5* 26.5*  PLT 251  --  172  < > 152 159 153 102* 101*  MCV 87.0  --  88.0  < > 87.9 84.7 86.3 85.5 84.1  MCH 27.7  --  28.6  < > 28.1 27.6 27.7 27.8 27.0  MCHC 31.8*  --  32.5  < > 32.0 32.6 32.1 32.5 32.2  RDW 18.3*  --  17.7*  < > 17.8* 18.8* 18.8* 18.3* 18.4*  LYMPHSABS 0.8*  --  0.7*  --  0.3* 0.5* 0.4*  --   --   MONOABS 1.4*  --  0.7  --  0.6 0.7 0.5  --   --   EOSABS 0.0  --  0.0  --  0.0 0.0 0.1  --   --   BASOSABS 0.1  --  0.0  --  0.0 0.0 0.0  --   --   < > = values in this interval not displayed.  Chemistries   Recent Labs Lab 06/09/2016 1306  05/16/16 0539  05/18/16 0504  05/18/16 1859 05/21/2016 05/18/2016 2220 05/20/16 0731 05/21/16 0518  NA 135  --  138  < > 146*  --   --  147* 145 145 146*  K 4.2  --  4.0  < > 2.9*  < > 3.1* 3.7 3.3* 3.9 3.3*  CL 96*  --  111  < > 120*  --   --  122* 121* 120* 122*  CO2 23  --  20*  < > 19*  --   --  18* 18* 18* 18*  GLUCOSE 171*  --  129*   < > 100*  --   --  75 107* 102* 241*  BUN 53*  --  48*  < > 28*  --   --  25* 21* 20 19  CREATININE 1.43*  --  1.15*  < >  0.91  --   --  0.86 0.78 0.91 0.88  CALCIUM 9.0  --  7.1*  < > 7.8*  --   --  7.9* 7.5* 7.5* 7.3*  MG  --   < > 2.1  < > 2.0  --   --  1.8 1.5* 2.5* 1.8  AST 30  --  32  --   --   --   --   --   --   --   --   ALT 17  --  16  --   --   --   --   --   --   --   --   ALKPHOS 74  --  54  --   --   --   --   --   --   --   --   BILITOT 0.8  --  1.2  --   --   --   --   --   --   --   --   < > = values in this interval not displayed. ------------------------------------------------------------------------------------------------------------------ estimated creatinine clearance is 45.9 mL/min (by C-G formula based on Cr of 0.88). ------------------------------------------------------------------------------------------------------------------ No results for input(s): HGBA1C in the last 72 hours. ------------------------------------------------------------------------------------------------------------------ No results for input(s): CHOL, HDL, LDLCALC, TRIG, CHOLHDL, LDLDIRECT in the last 72 hours. ------------------------------------------------------------------------------------------------------------------ No results for input(s): TSH, T4TOTAL, T3FREE, THYROIDAB in the last 72 hours.  Invalid input(s): FREET3 ------------------------------------------------------------------------------------------------------------------ No results for input(s): VITAMINB12, FOLATE, FERRITIN, TIBC, IRON, RETICCTPCT in the last 72 hours.  Coagulation profile  Recent Labs Lab 06/10/2016 1306 05/16/16 0539  INR 1.16 1.23    No results for input(s): DDIMER in the last 72 hours.  Cardiac Enzymes  Recent Labs Lab 05/16/2016 1752 06/05/2016 2325 05/16/16 0539  TROPONINI 0.29* 0.28* 0.19*    ------------------------------------------------------------------------------------------------------------------ Invalid input(s): POCBNP    Assessment & Plan   Patient is a 82 year old African-American female presenting with upper GI bleed  1. AAA with rupture- s/p Placement of a Gore Excluder endoprosthesis. Can resume tube feeding per Dr. Gilda Crease.  2. UPPER GI bleeding and anemia due to acute blood loss Could be due to gastritis or ulcer, but not a candidate for EGD this time per Dr. Markham Jordan. Hemoglobin is currently stable drifted down little bit. PRBC transfusion prn.   3. A flutter with RVR,  off  IV Cardizem and on cardizem via PEG. Increased to 60 mg Q8H.  4. Sepsis and hypotension Suspect due to abdominal aortic aneurysm rupture, on Zosyn.  Hypotension improved, still leukocytosis. F/u CBC.  5. Acute renal failure  improved  6. Elevated troponin due to demand ischemia  7. Severe hypokalemia replaced and f/u BMP.  Hypomagnesemia. Replaced and improved.  * abdominal distension, ielus versus distal SBO.  Hold GT feeding, IVF support, surgery consult.  I discussed with pt's daughter long time about pt's very poor prognosis and possible palliative care. Dr. Orvan Falconer will talk to her.  8. Code DO NOT RESUSCITATE      Code Status Orders        Start     Ordered   05/26/2016 1718  Do not attempt resuscitation (DNR)   Continuous    Question Answer Comment  In the event of cardiac or respiratory ARREST Do not call a "code blue"   In the event of cardiac or respiratory ARREST Do not perform Intubation, CPR, defibrillation or ACLS   In the event of cardiac or respiratory ARREST Use medication by any  route, position, wound care, and other measures to relive pain and suffering. May use oxygen, suction and manual treatment of airway obstruction as needed for comfort.      05/14/2016 1717    Code Status History    Date Active Date Inactive Code Status Order ID  Comments User Context   This patient has a current code status but no historical code status.    Advance Directive Documentation        Most Recent Value   Type of Advance Directive  Out of facility DNR (pink MOST or yellow form)   Pre-existing out of facility DNR order (yellow form or pink MOST form)  Yellow form placed in chart (order not valid for inpatient use)   "MOST" Form in Place?             Consults GI, CArdiology  DVT ProphylaxisSCDs  Lab Results  Component Value Date   PLT 101* 05/21/2016     Time Spent in minutes  48  min  Greater than 50% of time spent in care coordination and counseling patient regarding the condition and plan of care.   Shaune Pollack M.D on 05/21/2016 at 3:21 PM  Between 7am to 6pm - Pager - [phone]  After 6pm go to [email or web address] - password [email or web address] [facility]  [facility]   Office  [phone]

## 2016-05-21 NOTE — Progress Notes (Signed)
Pharmacy Consult for Electrolyte Monitoring Indication: Hypokalemia  Allergies  Allergen Reactions  . Atenolol Other (See Comments)    Unknown reaction.   Patient Measurements: Height: 5\' 6"  (167.6 cm) Weight: 166 lb 7.2 oz (75.5 kg) IBW/kg (Calculated) : 59.3  Vital Signs: Temp: 100.4 F (38 C) (06/09 0515) Temp Source: Oral (06/09 0515) BP: 158/104 mmHg (06/09 0515) Pulse Rate: 146 (06/09 0515) Intake/Output from previous day: 06/08 0701 - 06/09 0700 In: 3140 [P.O.:45; I.V.:1683.5; NG/GT:931.5; IV Piggyback:350] Out: 900 [Urine:500; Stool:400] Intake/Output from this shift:    Labs:  Recent Labs  05/16/2016 05/20/16 0731 05/21/16 0518  WBC 18.7* 17.2* 17.6*  HGB 9.5* 8.6* 8.5*  HCT 29.4* 26.5* 26.5*  PLT 153 102* 101*     Recent Labs  05/13/2016 2220 05/20/16 0731 05/21/16 0518  NA 145 145 146*  K 3.3* 3.9 3.3*  CL 121* 120* 122*  CO2 18* 18* 18*  GLUCOSE 107* 102* 241*  BUN 21* 20 19  CREATININE 0.78 0.91 0.88  CALCIUM 7.5* 7.5* 7.3*  MG 1.5* 2.5* 1.8  PHOS 2.7 2.5 1.8*   Estimated Creatinine Clearance: 45.9 mL/min (by C-G formula based on Cr of 0.88).    Recent Labs  05/20/16 2129  GLUCAP 183*    Medical History: Past Medical History  Diagnosis Date  . HTN (hypertension)   . AV block, 1st degree   . Atrial flutter (HCC)   . COPD (chronic obstructive pulmonary disease) (HCC)   . RA (rheumatoid arthritis) (HCC)   . Anemia   . Hyperlipidemia   . Heart failure (HCC)   . UTI (lower urinary tract infection)   . SSS (sick sinus syndrome) (HCC)   . Dementia   . Depression   . Appendicitis     Medications:  Scheduled:  . sodium chloride   Intravenous Once  . sodium chloride   Intravenous Once  . sodium chloride   Intravenous Once  . antiseptic oral rinse  7 mL Mouth Rinse q12n4p  . chlorhexidine  15 mL Mouth Rinse BID  . diltiazem  30 mg Oral Q6H  . free water  100 mL Per Tube Q4H  . ondansetron (ZOFRAN) IV  4 mg Intravenous Q6H  .  pantoprazole (PROTONIX) IV  40 mg Intravenous Q12H  . piperacillin-tazobactam (ZOSYN)  IV  3.375 g Intravenous Q8H  . sodium chloride flush  3 mL Intravenous Q12H   Infusions:  . sodium chloride 75 mL/hr at 05/20/16 0845  . feeding supplement (VITAL 1.5 CAL) 1,000 mL (05/20/16 1638)    Assessment: Pharmacy consulted to assist in managing and replacing electrolytes in this 82 y/o F admitted with GIB.   Plan:  K 3.3, phos 1.8, Mg 1.8. Give potassium phosphate 24 mmol IV x 1 and potassium chloride 10 mEq IV Q1H x 2 doses. Will recheck electrolytes this afternoon.  98, Pharm.D., BCPS Clinical Pharmacist 05/21/2016,7:19 AM

## 2016-05-21 NOTE — Progress Notes (Signed)
Dr. Michela Pitcher notified of patient's intermittent vomiting over the past hour. States to place PEG to gravity drainage. Trudee Kuster

## 2016-05-21 NOTE — Consult Note (Signed)
Palliative Medicine Inpatient Consult Note   Name: Amanda Mcdowell Date: 05/21/2016 MRN: [id number]  DOB: 1926/02/19  Referring Physician: Shaune Pollack, MD  Palliative Care consult requested for this 82 y.o. female for goals of medical therapy in patient with a recent ruptured aortic aneurysm and an ileus and other problems as outlined below.  DISCUSSIONS AND PLANS: I came back after a very long day and a late dinner to finish seeing pt and to call daughter. She had left at around 9:15 pm and had just gotten home when I called at 9:45 pm. She did not mind talking at this hour.  I presented the option of total comfort care --which would mean stopping tube feedings. I described this as allowing her to pass away from her diseases and conditions 'while not eating' ---since it is not really starvation, but just not being fed artificially while passing away of something else. Additionally, I learned that pt sometimes takes in food orally, so pt could get oral comfort foods if desired and if not vomiting  (small bites, sips, ice chips etc). I mentioned that Hospice could easily follow her mother back at [facility] if she wanted this. I recommended this approach when daughter told me pt has lived at [facility] for three years now and it is 'her home'.  She would not mind hospice following pt at all back at [facility].    Daughter is receptive to this, but not quite ready.  She would like to see how pt does over the weekend. This is b/c she seemed to 'be doing so well' yesterday --talking and interacting.    BUT, she will want to select Hospice at [facility] (with tube feedings stopped) IF pt is NO BETTER or IF she is WORSE.  She said no to intubation or aggressive oxygen treatments under any condition and she said no to dialysis if that should come up over the weekend.  In fact, daughter told me the following directive:  If she gets worse over the weekend, then doctors need to go ahead and  start comfort care and line up hospice at [facility].    If she stays the same or gets better, then I will meet with daughter Monday and we will discuss options at that time.  Since the definition of 'worse' may be somewhat vague, I would encourage her attending to call daughter if there is any question about pt getting 'worse' over this weekend-- as daughter seems pretty open to hospice care, but just not quite ready today. I discussed the problem with pt getting Cardizem via PEG and then this just coming back out ---and have ordered some prn Cardizem pushes 'just in case'.  If pt deteriorates and would need to go back to ICU, I would say that this is 'worsening'.     -----------------------------------------------------------  CLINICAL NARRATIVE:  Pt was sent to the ED on 6/3 due to her having hematemesis. At least that was what was reported. En route by EMS, she hd an episode of emesis of DARK BROWN fluid. An abdominal film was unremarkable at admission time.  Hgb was 10.3 at admission and after getting IV fluids, her Hgb dropped to 8.0 two days into her hospital stay. She was transfused on unit of PRBCs.   Her WBC was 29.6 at admission. Creatinine is up at 1.43She had complained of abdominal pain but due to her dementia, was not able to provide any information.  She had  Aflutter in the ED and was treated with Cardizem IV with blood pressure dropping subsequently.  Levophed was started.  She had her Hgb monitored closely and was NPO. Albumin was 2.9 at admission.  Lactic Acide was 5. And troponin was ).29 (elevated due to stress demand ischemia).   A GI consult was obtained on day of admission and it was thought that the vomiting she was having might have produced inflammation causing bleeding and conservative approach was advised.  She was given round the clock Zofran and vomiting stopped. Pt was on aspirin until about 6 months ago when this was DCd  due to vomiting.    By 6/6, the pt was off of  Levophed and she had had no further hematemesis.  Unfortunately, this is the same day that the CT scan showed a ruptured aortic aneurysm (see results below).  Dr Belia Heman, Critical Care, was given the CT results and he conveyed these to the daughter.  Vascular Surgery discussed possible palliative approach, but daughter desired surgical repair of the aneurysm.    I had seen pt to do full consult on 6/6 just at the time the CT scan came back showing a ruptured AAA. I had introduced myself to family member but said I would only follow at a distance until we could learn what would come of this emergent matter. Dr Gilda Crease, vascular surgeon, presented a palliatve approach vs surgery and family opted for surgery with stent graft. She had the surgery on 6/7.   Since that time, pt has moved to the floor but is having other problems. She was having some nausea and vomiting and has been found to have an ileus that is thought to be related to her recent surgery.  She has a history of multiple episodes of small bowel obstructions--one of which resulted in surgery and a PEG tube placement.  Surgery has been consulted and a conservative approach is advised.  If she vomits, then an NG tube is recommended for decompression.    She is already DNR status. She already has a PEG (placed about 1.5 yrs ago and meds and nutrition are supplied via this route due to dementia with loss of appetite.   She is already a long term nursing home resident (at [facility]) having had a stroke with dementia.  She is bedbound at baseline per report.  She does talk and recognizes her daughter -when at her best.    Now, pt has the same problem (ileus)  that likely precipitated the vomiting with hematemesis at time of admission. She may indeed only have an ileus, but the CT scan showed signs c/w ongoing ileus at the time the CT showed the aneurysm (though she had stopped the vomiting).  Review of records shows a number of similar episodes  with days and days of vomiting at times.  Pt  had an increase in vomiting over the latter part of the day on 6/9.  PEG tube was placed to gravity to help decompress abdomen of contents if needed.  This has helped.  Tube feedings are obviously held.   Urine and blood cxs have been negative thus far but she remains on Zosyn for sepsis.  She is getting Cardizem via tube (and this could present a problem if vomiting is going on).  She is felt to have sepsis with hypotension and WBC continues to be elevated.  Hypokalemia is corrected along with hypomagnesemia.   Daughter has been advised about pts very poor prognosis for  recovery.    ---------------------------------------------------------  PAST MEDICAL HISTORY: Past Medical History  Diagnosis Date  . HTN (hypertension)   . AV block, 1st degree   . Atrial flutter (HCC)   . COPD (chronic obstructive pulmonary disease) (HCC)   . RA (rheumatoid arthritis) (HCC)   . Anemia   . Hyperlipidemia   . Heart failure (HCC)   . UTI (lower urinary tract infection)   . SSS (sick sinus syndrome) (HCC)   . Dementia   . Depression   . Appendicitis     PAST SURGICAL HISTORY:  Past Surgical History  Procedure Laterality Date  . Peg placement    . Bowel obstruction surgery    . Peripheral vascular catheterization N/A 05/21/2016    Procedure: Endovascular Repair/Stent Graft;  Surgeon: [name], MD;  Location: [facility] INVASIVE CV LAB;  Service: Cardiovascular;  Laterality: N/A;     REVIEW OF SYSTEMS:  Patient is not able to provide ROS due to illness and dementia.  SPIRITUAL SUPPORT SYSTEM: Yes.  SOCIAL HISTORY:  reports that she has never smoked. She does not have any smokeless tobacco history on file. She reports that she does not drink alcohol or use illicit drugs. She is a long term care resident at [facility]. She is dependent for all ADLs due to advanced dementia and is fed via PEG. She is bedbound. She is DNR status.      CODE  STATUS: DNR   ALLERGIES:  is allergic to atenolol.  MEDICATIONS:  Current Facility-Administered Medications  Medication Dose Route Frequency Provider Last Rate Last Dose  . 0.9 %  sodium chloride infusion   Intravenous Continuous Shaune Pollack, MD 50 mL/hr at 05/21/16 1009    . 0.9 %  sodium chloride infusion   Intravenous Once Lewie Loron, NP      . 0.9 %  sodium chloride infusion   Intravenous Once Auburn Bilberry, MD      . 0.9 %  sodium chloride infusion   Intravenous Once Scot Jun, MD      . acetaminophen (TYLENOL) tablet 650 mg  650 mg Oral Q6H PRN Shaune Pollack, MD      . albuterol (PROVENTIL) (2.5 MG/3ML) 0.083% nebulizer solution 2.5 mg  2.5 mg Nebulization Q2H PRN Shaune Pollack, MD      . antiseptic oral rinse (CPC / CETYLPYRIDINIUM CHLORIDE 0.05%) solution 7 mL  7 mL Mouth Rinse q12n4p Auburn Bilberry, MD   7 mL at 05/21/16 1600  . chlorhexidine (PERIDEX) 0.12 % solution 15 mL  15 mL Mouth Rinse BID Auburn Bilberry, MD   15 mL at 05/21/16 1000  . diltiazem (CARDIZEM) tablet 60 mg  60 mg Oral Q8H Shaune Pollack, MD   60 mg at 05/21/16 1434  . feeding supplement (VITAL 1.5 CAL) liquid 1,000 mL  1,000 mL Per Tube Continuous Shaune Pollack, MD 45 mL/hr at 05/20/16 1638 1,000 mL at 05/20/16 1638  . free water 100 mL  100 mL Per Tube Q4H Shaune Pollack, MD   100 mL at 05/21/16 1600  . morphine 2 MG/ML injection 1-2 mg  1-2 mg Intravenous Q2H PRN Shaune Pollack, MD   2 mg at 05/21/16 1906  . ondansetron (ZOFRAN) injection 4 mg  4 mg Intravenous Q6H Scot Jun, MD   4 mg at 05/21/16 1737  . ondansetron (ZOFRAN) tablet 4 mg  4 mg Oral Q6H PRN Shaune Pollack, MD      . oxyCODONE (Oxy IR/ROXICODONE) immediate release tablet  5 mg  5 mg Oral Q4H PRN Shaune Pollack, MD      . pantoprazole (PROTONIX) injection 40 mg  40 mg Intravenous Q12H Auburn Bilberry, MD   40 mg at 05/21/16 1000  . piperacillin-tazobactam (ZOSYN) IVPB 3.375 g  3.375 g Intravenous [address] [place], RPH   3.375 g at 05/21/16 1434  . sodium chloride  flush (NS) 0.9 % injection 3 mL  3 mL Intravenous Q12H Shaune Pollack, MD   3 mL at 05/21/16 1000    Vital Signs: BP 144/99 mmHg  Pulse 51  Temp(Src) 98.9 F (37.2 C) (Oral)  Resp 18  Ht 5\' 6"  (1.676 m)  Wt 75.5 kg (166 lb 7.2 oz)  BMI 26.88 kg/m2  SpO2 97% Filed Weights   05/21/2016 1237 05/30/2016 1700  Weight: 81.647 kg (180 lb) 75.5 kg (166 lb 7.2 oz)    Estimated body mass index is 26.88 kg/(m^2) as calculated from the following:   Height as of this encounter: 5\' 6"  (1.676 m).   Weight as of this encounter: 75.5 kg (166 lb 7.2 oz).  PERFORMANCE STATUS (ECOG) : 4 - Bedbound  PHYSICAL EXAM: Lying in med-surg bed with head to one side and mouth open' Not really responsive to me but she did moan a bit when I rubbed her sternum. Eyes closed Nares patent Mouth w/o exudate No JVD or TM Hrt rrr rate 109 She has rare abd sounds She has a foley  She has a rectal collection system and it appears to have very loose stool in bag PEG tube is set to gravity with some scant material coming out of it per nursing.     LABS: CBC:    Component Value Date/Time   WBC 17.6* 05/21/2016 0518   WBC 13.0* 03/29/2015 1115   HGB 8.5* 05/21/2016 0518   HGB 13.1 03/29/2015 1115   HCT 26.5* 05/21/2016 0518   HCT 40.9 03/29/2015 1115   PLT 101* 05/21/2016 0518   PLT 290 03/29/2015 1115   MCV 84.1 05/21/2016 0518   MCV 89 03/29/2015 1115   NEUTROABS 17.7* 05/31/2016 0000   NEUTROABS 9.5* 03/29/2015 1115   LYMPHSABS 0.4* 05/29/2016 0000   LYMPHSABS 2.2 03/29/2015 1115   MONOABS 0.5 05/16/2016 0000   MONOABS 0.9 03/29/2015 1115   EOSABS 0.1 05/25/2016 0000   EOSABS 0.3 03/29/2015 1115   BASOSABS 0.0 05/27/2016 0000   BASOSABS 0.1 03/29/2015 1115   Comprehensive Metabolic Panel:    Component Value Date/Time   NA 145 05/21/2016 1614   NA 132* 03/28/2015 1130   K 4.1 05/21/2016 1614   K 3.9 03/28/2015 1130   CL 121* 05/21/2016 1614   CL 100* 03/28/2015 1130   CO2 20* 05/21/2016 1614    CO2 25 03/28/2015 1130   BUN 18 05/21/2016 1614   BUN 17 03/28/2015 1130   CREATININE 0.95 05/21/2016 1614   CREATININE 0.77 03/28/2015 1130   GLUCOSE 189* 05/21/2016 1614   GLUCOSE 137* 03/28/2015 1130   CALCIUM 7.4* 05/21/2016 1614   CALCIUM 9.2 03/28/2015 1130   AST 32 05/16/2016 0539   AST 20 03/28/2015 1130   ALT 16 05/16/2016 0539   ALT 15 03/28/2015 1130   ALKPHOS 54 05/16/2016 0539   ALKPHOS 76 03/28/2015 1130   BILITOT 1.2 05/16/2016 0539   BILITOT 0.3 03/28/2015 1130   PROT 4.8* 05/16/2016 0539   PROT 7.7 03/28/2015 1130   ALBUMIN 2.1* 05/16/2016 0539   ALBUMIN 3.7 03/28/2015 1130   CT scan  abd/ pelvis on 05/18/16: contained rupture of the infrarenal abdominal aorta measuring 8.3 x 9 cm. There is fluid/ blood tracking from the inferior portion of the contained abdominal aortic rupture extending to a hematoma in the anterior left abdomen measuring 8.9 x 5.4 cm. Mildly dilated small bowel loops in the pelvis with mild diffuse thick wall sigmoid colon.    More than 50% of the visit was spent in counseling/coordination of care: Yes  Time Spent: 60 minutes

## 2016-05-21 NOTE — Consult Note (Signed)
Amanda Mcdowell is a 82 y.o. female  with multiple medical problems who underwent an abdominal aortic aneurysm repair for ruptured aorta 48 hours ago. Stent graft was placed. She was admitted with lower GI bleeding. She has multiple previous small bowel obstructions having undergone surgery several years ago at [facility] and had a gastrostomy tube placed postsurgery. She has had a previous stroke. Plain films taken today demonstrated an ileus versus partial small bowel obstruction. The surgical service was consulted.  HPI: See above  Past Medical History  Diagnosis Date  . HTN (hypertension)   . AV block, 1st degree   . Atrial flutter (HCC)   . COPD (chronic obstructive pulmonary disease) (HCC)   . RA (rheumatoid arthritis) (HCC)   . Anemia   . Hyperlipidemia   . Heart failure (HCC)   . UTI (lower urinary tract infection)   . SSS (sick sinus syndrome) (HCC)   . Dementia   . Depression   . Appendicitis    Past Surgical History  Procedure Laterality Date  . Peg placement    . Bowel obstruction surgery    . Peripheral vascular catheterization N/A 05/24/2016    Procedure: Endovascular Repair/Stent Graft;  Surgeon: [name], MD;  Location: [facility] INVASIVE CV LAB;  Service: Cardiovascular;  Laterality: N/A;   Social History   Social History  . Marital Status: Divorced    Spouse Name: N/A  . Number of Children: N/A  . Years of Education: N/A   Social History Main Topics  . Smoking status: Never Smoker   . Smokeless tobacco: None  . Alcohol Use: No  . Drug Use: No  . Sexual Activity: Not Asked   Other Topics Concern  . None   Social History Narrative    Review of Systems: ROS review of systems is not possible in this situation as patient has been sedated and is not able to respond  PHYSICAL EXAM: BP 153/79 mmHg  Pulse 110  Temp(Src) 98.4 F (36.9 C) (Oral)  Resp 20  Ht 5\' 6"  (1.676 m)  Wt 75.5 kg (166 lb 7.2 oz)  BMI 26.88 kg/m2  SpO2 100%  Physical Exam   Constitutional: She appears well-nourished. No distress.  HENT:  Facial asymmetry  Eyes: Conjunctivae are normal. Pupils are equal, round, and reactive to light. No scleral icterus.  Neck:  Decreased range of motion, no thyromegaly,  Cardiovascular: Normal rate, regular rhythm and normal heart sounds.   Pulmonary/Chest: Effort normal and breath sounds normal.  Abdominal: Soft. She exhibits distension. There is no tenderness. There is no rebound and no guarding.  Musculoskeletal: Normal range of motion. She exhibits no edema or tenderness.  Neurological:  She is poorly responsive secondary to her medications. She is not well oriented.  Skin: Skin is warm and dry.   Her abdomen is markedly distended tympanitic with high-pitched bowel sounds.  Impression/Plan: She has history of multiple previous bowel obstructions one of which required surgery. At the present time she is not vomiting. She recently had an aortic aneurysm rupture with stent graft placement which certainly could precipitate an ileus. I do not see any indication for surgical intervention at this time. If she vomits nasogastric decompression may be of some benefit.   III, MD  05/21/2016, 4:01 PM

## 2016-05-21 NOTE — Progress Notes (Signed)
Pharmacy Antibiotic Note  Amanda Mcdowell is a 82 y.o. female admitted on 06/04/2016 with sepsis.  Pharmacy has been consulted for vancomycin and Zosyn dosing. Vancomycin d/c 6/4.  GI bleed. From SNF  Plan: Continue Zosyn 3.375 grams q 8 hours.   Height: 5\' 6"  (167.6 cm) Weight: 166 lb 7.2 oz (75.5 kg) IBW/kg (Calculated) : 59.3  Temp (24hrs), Avg:99.1 F (37.3 C), Min:98.4 F (36.9 C), Max:100.4 F (38 C)   Recent Labs Lab 05/16/2016 1752 06/01/2016 2100 06/09/2016 2325 05/16/16 0539  05/17/16 0436 05/18/16 0504 05/27/2016 05/20/2016 2220 05/20/16 0731 05/21/16 0518  WBC  --   --   --  27.2*  < > 16.5* 17.5* 18.7*  --  17.2* 17.6*  CREATININE  --   --   --  1.15*  --  1.06* 0.91 0.86 0.78 0.91 0.88  LATICACIDVEN 5.9* 5.1* 5.1* 3.7*  --  1.9  --   --   --   --   --   < > = values in this interval not displayed.  Estimated Creatinine Clearance: 45.9 mL/min (by C-G formula based on Cr of 0.88).    Allergies  Allergen Reactions  . Atenolol Other (See Comments)    Unknown reaction.    Antimicrobials this admission: vancomycin  6/3>> 6/4 Zosyn 6/3 >>   Dose adjustments this admission:   Microbiology results: 6/3 BCx: NGTD x 2 6/3 UCx: negative 6/3 MRSA PCR: (-)   6/3 CXR: L base atelectasis 6/3 UA: LE (tr) NO2(-) WBC 0-5  Thank you for allowing pharmacy to be a part of this patient's care.  Shawna Kiener C 05/21/2016 3:45 PM

## 2016-05-21 NOTE — Progress Notes (Signed)
Palliative Care Update  Late entry for 05/18/16:  Nonbillable rounding.  Note that I had seen pt and was just starting to initiate full consult and conversation with family when pt's CT report came back showing a ruptured aneurysm.  Dr Belia Heman and I were both outside pts room discussing this when family arrived. Dr Belia Heman informed family about the emergency finding of the AAA rupture.   I introduced myself but said that I would not be involved right away as we had to see what came of this matter.  I said I would check back later.  I am following at a distance until it is clear that a palliative approach/ conversation might again need to be addressed.    Suan Halter, MD

## 2016-05-21 NOTE — Progress Notes (Signed)
S: Patient experienced increasing vomiting.  O: Vital signs stable Abdomen moderately distended hypoactive bowel sounds Radiographs consistent with ileus versus small bowel obstruction  A: Status post endovascular repair of abdominal aortic aneurysm      Postoperative ileus versus small bowel obstruction  P: No further surgery or interventions at this time. Her G-tube is been opened to gravity to decompress. I am most suspicious this represents an ileus secondary to the repair of her abdominal aortic aneurysm and should resolve over the next 48-72 hours with conservative management.

## 2016-05-22 ENCOUNTER — Inpatient Hospital Stay: Payer: Medicare Other

## 2016-05-22 LAB — BASIC METABOLIC PANEL
Anion gap: 5 (ref 5–15)
BUN: 21 mg/dL — AB (ref 6–20)
CALCIUM: 7.5 mg/dL — AB (ref 8.9–10.3)
CHLORIDE: 120 mmol/L — AB (ref 101–111)
CO2: 20 mmol/L — AB (ref 22–32)
CREATININE: 0.88 mg/dL (ref 0.44–1.00)
GFR calc non Af Amer: 57 mL/min — ABNORMAL LOW (ref >60)
GLUCOSE: 125 mg/dL — AB (ref 65–99)
Potassium: 3.9 mmol/L (ref 3.5–5.1)
Sodium: 145 mmol/L (ref 135–145)

## 2016-05-22 LAB — CBC
HEMATOCRIT: 29.6 % — AB (ref 35.0–47.0)
HEMOGLOBIN: 9.5 g/dL — AB (ref 12.0–16.0)
MCH: 27.4 pg (ref 26.0–34.0)
MCHC: 32.1 g/dL (ref 32.0–36.0)
MCV: 85.1 fL (ref 80.0–100.0)
Platelets: 102 10*3/uL — ABNORMAL LOW (ref 150–440)
RBC: 3.48 MIL/uL — ABNORMAL LOW (ref 3.80–5.20)
RDW: 18.4 % — AB (ref 11.5–14.5)
WBC: 16.9 10*3/uL — ABNORMAL HIGH (ref 3.6–11.0)

## 2016-05-22 LAB — PHOSPHORUS: PHOSPHORUS: 3.5 mg/dL (ref 2.5–4.6)

## 2016-05-22 LAB — MAGNESIUM: Magnesium: 1.8 mg/dL (ref 1.7–2.4)

## 2016-05-22 MED ORDER — DILTIAZEM HCL 25 MG/5ML IV SOLN
10.0000 mg | Freq: Once | INTRAVENOUS | Status: AC
  Administered 2016-05-22: 10 mg via INTRAVENOUS

## 2016-05-22 MED ORDER — FUROSEMIDE 10 MG/ML IJ SOLN
20.0000 mg | Freq: Two times a day (BID) | INTRAMUSCULAR | Status: DC
  Administered 2016-05-22: 20 mg via INTRAVENOUS
  Filled 2016-05-22: qty 2

## 2016-05-22 MED ORDER — DILTIAZEM HCL 25 MG/5ML IV SOLN
INTRAVENOUS | Status: AC
  Administered 2016-05-22: 18:00:00
  Filled 2016-05-22: qty 5

## 2016-05-22 MED ORDER — MORPHINE SULFATE (PF) 2 MG/ML IV SOLN: 1.0000 mg | INTRAVENOUS | Status: DC | PRN

## 2016-05-27 LAB — PREPARE FRESH FROZEN PLASMA: Unit division: 0

## 2016-06-12 NOTE — Progress Notes (Addendum)
Per MD Imogene Burn administer 10 mg IV cardizem once for HR 125-145  Administered med, was effective, HR decreased to 112-123

## 2016-06-12 NOTE — Progress Notes (Signed)
Patient's granddaughter requested this RN to assess patient's breathing. RN assessed patient and rhonchi heard in both lungs. Suctioned patient and thin, green liquid came out. CCMD notified RN that patient's HR dropped into the 30s. Patient's breathing at that time became very shallow and patient was no longer responding to stimuli. RN and Consulting civil engineer attempted to obtain a set of vital signs and listened for an apical pulse. No vitals signs and no apical pulse detected. Notified on-call MD Gery Pray of situation and MD came up to the floor to assess the patient. Time of death declared at 04/25/2010. Patient's granddaughter notified her family of the patient's passing and the on-call chaplain was notified.  Donor Services notified and Post-Mortem checklist being completed. Family at bedside. Lamonte Richer, RN

## 2016-06-12 NOTE — Discharge Summary (Signed)
[facility] [place] at [facility]   PATIENT NAME: Amanda Mcdowell    MR#:  [id number]  DATE OF BIRTH:  Nov 06, 1926  DATE OF ADMISSION:  06/02/2016 ADMITTING PHYSICIAN: Shaune Pollack, MD  DATE OF expiration: 05/23/2016 PRIMARY CARE PHYSICIAN: No primary care provider on file.    ADMISSION DIAGNOSIS:  Acute upper GI bleed [K92.2] Atrial flutter with rapid ventricular response (HCC) [I48.92] Hematemesis, presence of nausea not specified [K92.0]   DISCHARGE DIAGNOSIS:  UPPER GI bleeding and anemia due to acute blood loss AAA with rupture Sepsis and hypotension A flutter with RVR, Acute renal failure  Pulmonary edema SECONDARY DIAGNOSIS:   Past Medical History  Diagnosis Date  . HTN (hypertension)   . AV block, 1st degree   . Atrial flutter (HCC)   . COPD (chronic obstructive pulmonary disease) (HCC)   . RA (rheumatoid arthritis) (HCC)   . Anemia   . Hyperlipidemia   . Heart failure (HCC)   . UTI (lower urinary tract infection)   . SSS (sick sinus syndrome) (HCC)   . Dementia   . Depression   . Appendicitis     HOSPITAL COURSE:   1. AAA with rupture- s/p Placement of a Gore Excluder endoprosthesis.  2. UPPER GI bleeding and anemia due to acute blood loss Could be due to gastritis or ulcer, but not a candidate for EGD this time per Dr. Markham Jordan. Hemoglobin is stable.  3. A flutter with RVR,  off IV Cardizem and on cardizem via PEG. Increased to 60 mg Q8H.  4. Sepsis and hypotension Suspect due to abdominal aortic aneurysm rupture, on Zosyn.  Hypotension improved, Improving leukocytosis.  5. Acute renal failure improved  6. Elevated troponin due to demand ischemia  7. Severe hypokalemia, replaced and improved.  Hypomagnesemia. Replaced and improved.  * abdominal distension, ielus.  Hold GT feeding, IVF support, per Dr. Michela Pitcher, surgery consult, no indication for surgery.  * Pulmonary edema. Patient's long sounds crackles, Chest x-ray show  pulmonary edema. Started Lasix yesterday.   Per Dr. Orvan Falconer, the patient's daughter agreed to comfort care if condition is worsening. She wants to watch over the weekend.  I discussed with Dr. Orvan Falconer.  Last night, patient's HR dropped into the 30s. Patient's breathing at that time became very shallow and patient was no longer responding to stimuli. RN and Consulting civil engineer attempted to obtain a set of vital signs and listened for an apical pulse. No vitals signs and no apical pulse detected. Notified on-call MD Gery Pray of situation and MD came up to the floor to assess the patient. Time of death declared at 20:11.  DISCHARGE CONDITIONS:   The patient expired at 20:11, 06-15-16.  CONSULTS OBTAINED:  Treatment Team:  Scot Jun, MD Lamar Blinks, MD [name], MD Salley Hews, MD   Shaune Pollack M.D on 05/23/2016 at 1:12 PM  Between 7am to 6pm - Pager - [phone]  After 6pm go to [email or web address] - password [email or web address] [facility]  [place] [facility]  Office  [phone]  CC: Primary care physician; No primary care provider on file.

## 2016-06-12 NOTE — Progress Notes (Signed)
MD Imogene Burn notified of gurgling/coarse crackles.  He will order Lasix.  OK to DC feeding/free water orders as pt is currently unable to tolerate them and they have been off since yesterday.

## 2016-06-12 NOTE — Progress Notes (Signed)
Called by nursing. Patient became systolic at 20:11. Went to bedside, patient examined. Patient had passed. Patient pronounced. Time of death 20:11.

## 2016-06-12 NOTE — Progress Notes (Signed)
Per RT, pt sounds wet, atropine would me minimally helpful.  Will call on call MD for a morphine order

## 2016-06-12 NOTE — Progress Notes (Signed)
IJ line is a triple lumen.  Proximal and distal lines have been flushed and are saline locked.  NS is infusing into the medial line.

## 2016-06-12 NOTE — Progress Notes (Signed)
1430  Cardizem given via PEG tube. Tube clamped for 60 minutes

## 2016-06-12 NOTE — Progress Notes (Signed)
PT Cancellation Note  Patient Details Name: Lajuan Godbee MRN: [id number] DOB: September 08, 1926   Cancelled Treatment:    Reason Eval/Treat Not Completed: Other (comment) (Family declined PT as pt is bed level at baseline).  Daughter is point of contact for pt.   Ivar Drape 06/08/2016, 1:11 PM    Samul Dada, PT MS Acute Rehab Dept. Number: [facility] [id number] and [facility] [phone]

## 2016-06-12 NOTE — Progress Notes (Signed)
[facility] provided pastoral care during pt's death. Offered spiritual support and prayer consistent w/pt's faith tradition.

## 2016-06-12 NOTE — Progress Notes (Signed)
Her heart rate is running between 130 and 140.  Cardizem 5 mg given slow IV push.

## 2016-06-12 NOTE — Progress Notes (Signed)
Upon rolling patient over from side lying position during bath, this RN noted small amount of Amanda Mcdowell gastric secretion run out of patient's mouth.  Patient minimally responsive except to discomfort/pain.

## 2016-06-12 NOTE — Progress Notes (Signed)
Brief Nutrition Note:   Chart reviewed. Noted pt vomiting yesterday, even with TF on hold. PEG tube now to gravity drainange. TF orders discontinued this AM. Remains NPO. Pt evaluated by Surgery, pt likely with postop ileus. Noted palliative care following as well, further poc to be determined.   When/If ready to resume TF, recommend starting Vital 1.5 at rate of 15 ml/hr with slow titration. Goal rate remains 45 ml/hr to meet nutritional needs. As previously noted, pt has been vomiting on and off per RD at [facility] over the past 6 months, TF has been held frequently due to this as well. Vomiting appears to be a chronic problem for pt.   Will continue to follow and make recommendations as needed.  Romelle Starcher MS, RD, LDN [phone] Pager  [phone] Weekend/On-Call Pager

## 2016-06-12 NOTE — Progress Notes (Signed)
Pharmacy Consult for Electrolyte Monitoring Indication: Hypokalemia  Allergies  Allergen Reactions  . Atenolol Other (See Comments)    Unknown reaction.   Patient Measurements: Height: 5\' 6"  (167.6 cm) Weight: 166 lb 7.2 oz (75.5 kg) IBW/kg (Calculated) : 59.3  Vital Signs: Temp: 99.4 F (37.4 C) (06/10 0552) Temp Source: Oral (06/10 0552) BP: 117/65 mmHg (06/10 0552) Pulse Rate: 67 (06/10 0552) Intake/Output from previous day: 06/09 0701 - 06/10 0700 In: -  Out: 600 [Urine:600] Intake/Output from this shift:    Labs:  Recent Labs  05/20/16 0731 05/21/16 0518 05/29/2016 0516  WBC 17.2* 17.6* 16.9*  HGB 8.6* 8.5* 9.5*  HCT 26.5* 26.5* 29.6*  PLT 102* 101* 102*     Recent Labs  05/20/16 0731 05/21/16 0518 05/21/16 1614 06/09/2016 0516  NA 145 146* 145 145  K 3.9 3.3* 4.1 3.9  CL 120* 122* 121* 120*  CO2 18* 18* 20* 20*  GLUCOSE 102* 241* 189* 125*  BUN 20 19 18  21*  CREATININE 0.91 0.88 0.95 0.88  CALCIUM 7.5* 7.3* 7.4* 7.5*  MG 2.5* 1.8  --  1.8  PHOS 2.5 1.8*  --  3.5  PREALBUMIN  --  3.9*  --   --    Estimated Creatinine Clearance: 45.9 mL/min (by C-G formula based on Cr of 0.88).    Recent Labs  05/20/16 2129  GLUCAP 183*    Medical History: Past Medical History  Diagnosis Date  . HTN (hypertension)   . AV block, 1st degree   . Atrial flutter (HCC)   . COPD (chronic obstructive pulmonary disease) (HCC)   . RA (rheumatoid arthritis) (HCC)   . Anemia   . Hyperlipidemia   . Heart failure (HCC)   . UTI (lower urinary tract infection)   . SSS (sick sinus syndrome) (HCC)   . Dementia   . Depression   . Appendicitis     Medications:  Scheduled:  . sodium chloride   Intravenous Once  . sodium chloride   Intravenous Once  . sodium chloride   Intravenous Once  . antiseptic oral rinse  7 mL Mouth Rinse q12n4p  . chlorhexidine  15 mL Mouth Rinse BID  . diltiazem  60 mg Oral Q8H  . free water  100 mL Per Tube Q4H  . ondansetron (ZOFRAN)  IV  4 mg Intravenous Q6H  . pantoprazole (PROTONIX) IV  40 mg Intravenous Q12H  . piperacillin-tazobactam (ZOSYN)  IV  3.375 g Intravenous Q8H  . sodium chloride flush  3 mL Intravenous Q12H   Infusions:  . sodium chloride 50 mL/hr at 06/04/2016 0555  . feeding supplement (VITAL 1.5 CAL) 1,000 mL (05/20/16 1638)    Assessment: Pharmacy consulted to assist in managing and replacing electrolytes in this 82 y/o F admitted with GIB.   Plan:  Electrolytes WNL.  No supplementation needed at this time.   Will check electrolytes with am labs.  Pharmacy will continue to follow and supplement as appropriate.  07/20/16, RPh Clinical Pharmacist 06/03/2016,8:09 AM

## 2016-06-12 NOTE — Progress Notes (Signed)
Patient's daughter called wanting to know how patient did overnight; all questions answered and no further concerns at this time. Nursing staff will continue to monitor. Lamonte Richer, RN

## 2016-06-12 NOTE — Progress Notes (Signed)
[facility] - [place] at [facility]                                                                                                                                                                                            Patient Demographics   Amanda Mcdowell, is a 82 y.o. female, DOB - 23-Aug-1926, EUM:[id number]  Admit date - 06/11/2016   Admitting Physician Shaune Pollack, MD  Outpatient Primary MD for the patient is No primary care provider on file.   LOS - 7  Subjective:CT scan showed a contained rupture of infrarenal abdominal aorta measuring A 0.3 x 9 cm as well as extending to a hematoma in the anterior left abdominal. S/p AAA repair. Off Cardizem drip. Patient is demented and lethargy, still abd pain and distension. Tube feeding was discontinued. Review of Systems:    Unable to get ROS.    Vitals:   Filed Vitals:   05/21/16 1931 06/01/2016 0552 05/13/2016 0827 05/27/2016 1141  BP: 144/99 117/65 131/81 132/87  Pulse: 51 67 80 152  Temp: 98.9 F (37.2 C) 99.4 F (37.4 C) 99.2 F (37.3 C) 99.6 F (37.6 C)  TempSrc: Oral Oral Oral Axillary  Resp: 18 18 18 20   Height:      Weight:      SpO2: 97% 90% 97% 97%    Wt Readings from Last 3 Encounters:  11-Jun-2016 166 lb 7.2 oz (75.5 kg)     Intake/Output Summary (Last 24 hours) at 05/27/2016 1243 Last data filed at 06/05/2016 1027  Gross per 24 hour  Intake      3 ml  Output    500 ml  Net   -497 ml    Physical Exam:   GENERAL: Critically ill-appearing,Lethargic. HEAD, EYES, EARS, NOSE AND THROAT: Atraumatic, normocephalic. Pale conjunctiva. Pupils equal and reactive to light. Sclerae anicteric.  NECK: Supple. There is no jugular venous distention. No bruits, no lymphadenopathy, no thyromegaly.  HEART: Irregularly irregular. No murmurs, no rubs, no clicks.  LUNGS: Bilateral air entry. Bilateral crackles and rhonchi. No wheezes.  No use of accessory muscles to breath. ABDOMEN: Soft, flat,  Tenderness and  distended. weak bowel sounds. Unable to estimate  hepatosplenomegaly. Some leakage from PEG site, in dressing. EXTREMITIES: No evidence of any cyanosis, clubbing, or peripheral edema.  +2 pedal and radial pulses bilaterally.  NEUROLOGIC: demented and lethargic. SKIN: Moist and warm with no rashes appreciated.  Psych: Not anxious, depressed LN: No inguinal LN enlargement    Antibiotics   Anti-infectives    Start     Dose/Rate Route Frequency Ordered Stop   05/17/16 0500  vancomycin (VANCOCIN) IVPB 1000 mg/200 mL premix  Status:  Discontinued     1,000 mg 200 mL/hr over 60 Minutes Intravenous Every 24 hours 05/16/16 1156 05/16/16 1426   05/16/16 0300  vancomycin (VANCOCIN) 1,250 mg in sodium chloride 0.9 % 250 mL IVPB  Status:  Discontinued     1,250 mg 166.7 mL/hr over 90 Minutes Intravenous Every 36 hours 05/16/16 0048 05/16/16 1156   05/25/2016 1800  vancomycin (VANCOCIN) IVPB 1000 mg/200 mL premix     1,000 mg 200 mL/hr over 60 Minutes Intravenous  Once 05/29/2016 1717 06/03/2016 2030   05/14/2016 1800  piperacillin-tazobactam (ZOSYN) IVPB 3.375 g     3.375 g 12.5 mL/hr over 240 Minutes Intravenous Every 8 hours 05/25/2016 1733     05/16/2016 1730  piperacillin-tazobactam (ZOSYN) IVPB 3.375 g  Status:  Discontinued     3.375 g 100 mL/hr over 30 Minutes Intravenous  Once 05/29/2016 1717 05/17/2016 1732   06/09/2016 1630  piperacillin-tazobactam (ZOSYN) IVPB 3.375 g  Status:  Discontinued     3.375 g 100 mL/hr over 30 Minutes Intravenous  Once 05/17/2016 1559 05/23/2016 1733   06/06/2016 1630  vancomycin (VANCOCIN) IVPB 1000 mg/200 mL premix  Status:  Discontinued     1,000 mg 200 mL/hr over 60 Minutes Intravenous  Once 05/14/2016 1602 05/30/2016 1734      Medications   Scheduled Meds: . sodium chloride   Intravenous Once  . sodium chloride   Intravenous Once  . sodium chloride   Intravenous Once  . antiseptic oral rinse  7 mL Mouth Rinse q12n4p  . chlorhexidine  15 mL Mouth Rinse BID  . diltiazem   60 mg Oral Q8H  . furosemide  20 mg Intravenous Q12H  . ondansetron (ZOFRAN) IV  4 mg Intravenous Q6H  . pantoprazole (PROTONIX) IV  40 mg Intravenous Q12H  . piperacillin-tazobactam (ZOSYN)  IV  3.375 g Intravenous Q8H  . sodium chloride flush  3 mL Intravenous Q12H   Continuous Infusions: . sodium chloride 50 mL/hr at 2016/06/17 0555   PRN Meds:.acetaminophen, albuterol, diltiazem, morphine injection, oxyCODONE   Data Review:   Micro Results Recent Results (from the past 240 hour(s))  Urine culture     Status: None   Collection Time: 05/20/2016  4:00 PM  Result Value Ref Range Status   Specimen Description URINE, RANDOM  Final   Special Requests NONE  Final   Culture NO GROWTH Performed at [facility]   Final   Report Status 05/16/2016 FINAL  Final  MRSA PCR Screening     Status: None   Collection Time: 06/11/2016  5:51 PM  Result Value Ref Range Status   MRSA by PCR NEGATIVE NEGATIVE Final    Comment:        The GeneXpert MRSA Assay (FDA approved for NASAL specimens only), is one component of a comprehensive MRSA colonization surveillance program. It is not intended to diagnose MRSA infection nor to guide or monitor treatment for MRSA infections.   Culture, blood (x 2)     Status: None   Collection Time: 05/31/2016  5:52 PM  Result Value Ref Range Status   Specimen Description BLOOD LEFT AC  Final   Special Requests   Final    BOTTLES DRAWN AEROBIC AND ANAEROBIC  ANA , AER 2 ML   Culture NO GROWTH 5 DAYS  Final   Report Status 05/20/2016 FINAL  Final  Culture, blood (x 2)     Status: None   Collection  Time: 06/09/2016  7:17 PM  Result Value Ref Range Status   Specimen Description BLOOD LEFT WRIST  Final   Special Requests   Final    BOTTLES DRAWN AEROBIC AND ANAEROBIC AEROBIC 15CC, ANAEROBIC 10CC   Culture NO GROWTH 5 DAYS  Final   Report Status 05/20/2016 FINAL  Final  Urine culture     Status: None   Collection Time: 06/06/2016 10:06 PM  Result Value Ref  Range Status   Specimen Description URINE, CLEAN CATCH  Final   Special Requests Normal  Final   Culture NO GROWTH Performed at [facility]   Final   Report Status 05/18/2016 FINAL  Final    Radiology Reports Ct Abdomen Pelvis Wo Contrast  05/18/2016  CLINICAL DATA:  Abdomen pain.  Upper GI bleeding. EXAM: CT ABDOMEN AND PELVIS WITHOUT CONTRAST TECHNIQUE: Multidetector CT imaging of the abdomen and pelvis was performed following the standard protocol without IV contrast. COMPARISON:  January 20, 2015 FINDINGS: Lower chest: The heart has is enlarged. There are small bilateral pleural effusions. Patchy atelectasis is identified in bilateral lung bases. Hepatobiliary: The liver and gallbladder are normal. No mass visualized on this un-enhanced exam. Pancreas: No mass or inflammatory process identified on this un-enhanced exam. Spleen: Within normal limits in size. Adrenals/Urinary Tract: The adrenal glands are normal. Right renal vascular calcification is noted. No evidence of urolithiasis or hydronephrosis. No definite mass visualized on this un-enhanced exam. Evaluation of bladder is limited due to metallic artifact from bilateral hip replacements. Stomach/Bowel: PEG tube is identified. There is a broad-based anterior abdominal and pelvic herniation of stomach, bowel loops and mesenteric fat. There are few mildly dilated small bowel bowel loops in the anterior pelvis. There is mild diffuse bowel wall thickening of the sigmoid colon. Vascular/Lymphatic: There is contained rupture of the infrarenal abdominal aorta measuring 8.3 x 9 cm. There is fluid/ blood tracking from the inferior portion of the contained abdominal aortic rupture extending to a hematoma in the anterior left abdomen measuring 8.9 x 5.4 cm. No pathologically enlarged lymph nodes. Reproductive: Evaluation is limited due to metallic artifact from bilateral hip replacements. Other: None. Musculoskeletal: Extensive degenerative joint  changes are identified in the spine. Bilateral hip replacements are noted. IMPRESSION: contained rupture of the infrarenal abdominal aorta measuring 8.3 x 9 cm. There is fluid/ blood tracking from the inferior portion of the contained abdominal aortic rupture extending to a hematoma in the anterior left abdomen measuring 8.9 x 5.4 cm. Mildly dilated small bowel loops in the pelvis with mild diffuse thick wall sigmoid colon. Critical Value/emergent results were called by telephone at the time of interpretation on 05/18/2016 at 4:22 pm to Dr. Belia Heman, who verbally acknowledged these results. Electronically Signed   By: Sherian Rein M.D.   On: 05/18/2016 16:22   Dg Abd 1 View  05/21/2016  CLINICAL DATA:  Abdominal distention, vomiting. EXAM: ABDOMEN - 1 VIEW COMPARISON:  CT 05/18/2016 FINDINGS: Gastrostomy tube projects over the left abdomen. There are dilated bowel loops in the abdomen and pelvis, likely a combination of small and large bowel. Favor ileus although small bowel obstruction cannot be completely excluded. No free air organomegaly. Prior stent graft repair of abdominal aortic aneurysm. IMPRESSION: Dilated bowel within the abdomen and pelvis, favor ileus although distal small bowel obstruction cannot be excluded. Electronically Signed   By: Charlett Nose M.D.   On: 05/21/2016 12:57   Dg Abd 1 View  05/16/2016  CLINICAL DATA:  Abdominal pain EXAM:  ABDOMEN - 1 VIEW COMPARISON:  06-02-2016 FINDINGS: Gastrostomy tube projects over the left abdomen. Nonobstructive bowel gas pattern. No free air organomegaly. No acute bony abnormality. Bilateral hip replacements noted. IMPRESSION: No acute findings. Electronically Signed   By: Charlett Nose M.D.   On: 05/16/2016 07:21   Dg Abd 1 View  2016-06-02  CLINICAL DATA:  Pain, vomiting. EXAM: ABDOMEN - 1 VIEW COMPARISON:  None FINDINGS: Nonspecific bowel gas pattern. Mild dilatation of bowel in the right pelvis which is likely the cecum filled with stool. No convincing  evidence for bowel obstruction. No organomegaly or free air. Gastrostomy tube projects over the stomach. Visualized lung bases are clear. IMPRESSION: Nonspecific bowel gas pattern without evidence for obstruction. Large stool burden in the right colon. Electronically Signed   By: Charlett Nose M.D.   On: June 02, 2016 13:53   Dg Chest Port 1 View  05/25/2016  CLINICAL DATA:  Cough. Aortic aneurysm. Nonsmoker with heart failure. EXAM: PORTABLE CHEST 1 VIEW COMPARISON:  06/02/16 FINDINGS: A probable right internal jugular line, new. This terminates over the mid right atrium. Patient rotated left. Numerous other catheters and wires project over the chest. Cardiomegaly accentuated by AP portable technique. Tortuous thoracic aorta. Atherosclerosis in the transverse aorta. Probable small bilateral pleural effusions. No pneumothorax. Mild interstitial edema. Persistent left base airspace disease. New right upper and right lower lobe airspace disease. IMPRESSION: Cardiomegaly with development of mild interstitial edema. Bilateral pleural effusions with persistent left base Airspace disease, likely atelectasis. Right upper lobe airspace disease is new and could represent infection or aspiration. Probable right internal jugular line, projecting over the mid right atrium. If cavoatrial junction position is desired, this should be retracted 3-4 cm. Electronically Signed   By: Jeronimo Greaves M.D.   On: 06/06/2016 09:40   Dg Chest Portable 1 View  02-Jun-2016  CLINICAL DATA:  Pain, vomiting. EXAM: PORTABLE CHEST 1 VIEW COMPARISON:  None. FINDINGS: Mild cardiomegaly. Minimal left base atelectasis. Right lung is clear. No effusions. Heart is normal size. IMPRESSION: Left base atelectasis.  Mild cardiomegaly. Electronically Signed   By: Charlett Nose M.D.   On: June 02, 2016 13:53     CBC  Recent Labs Lab 2016-06-02 1306  05/16/16 0539  05/17/16 0436 05/18/16 0504 05/17/2016 05/20/16 0731 05/21/16 0518 05/25/2016 0516  WBC 29.6*   --  27.2*  < > 16.5* 17.5* 18.7* 17.2* 17.6* 16.9*  HGB 10.3*  < > 8.2*  < > 8.0* 10.0* 9.5* 8.6* 8.5* 9.5*  HCT 32.4*  --  25.3*  < > 25.1* 30.8* 29.4* 26.5* 26.5* 29.6*  PLT 251  --  172  < > 152 159 153 102* 101* 102*  MCV 87.0  --  88.0  < > 87.9 84.7 86.3 85.5 84.1 85.1  MCH 27.7  --  28.6  < > 28.1 27.6 27.7 27.8 27.0 27.4  MCHC 31.8*  --  32.5  < > 32.0 32.6 32.1 32.5 32.2 32.1  RDW 18.3*  --  17.7*  < > 17.8* 18.8* 18.8* 18.3* 18.4* 18.4*  LYMPHSABS 0.8*  --  0.7*  --  0.3* 0.5* 0.4*  --   --   --   MONOABS 1.4*  --  0.7  --  0.6 0.7 0.5  --   --   --   EOSABS 0.0  --  0.0  --  0.0 0.0 0.1  --   --   --   BASOSABS 0.1  --  0.0  --  0.0  0.0 0.0  --   --   --   < > = values in this interval not displayed.  Chemistries   Recent Labs Lab 05/14/2016 1306  05/16/16 0539  05/16/2016 05/24/2016 2220 05/20/16 0731 05/21/16 0518 05/21/16 1614 05-31-2016 0516  NA 135  --  138  < > 147* 145 145 146* 145 145  K 4.2  --  4.0  < > 3.7 3.3* 3.9 3.3* 4.1 3.9  CL 96*  --  111  < > 122* 121* 120* 122* 121* 120*  CO2 23  --  20*  < > 18* 18* 18* 18* 20* 20*  GLUCOSE 171*  --  129*  < > 75 107* 102* 241* 189* 125*  BUN 53*  --  48*  < > 25* 21* 20 19 18  21*  CREATININE 1.43*  --  1.15*  < > 0.86 0.78 0.91 0.88 0.95 0.88  CALCIUM 9.0  --  7.1*  < > 7.9* 7.5* 7.5* 7.3* 7.4* 7.5*  MG  --   < > 2.1  < > 1.8 1.5* 2.5* 1.8  --  1.8  AST 30  --  32  --   --   --   --   --   --   --   ALT 17  --  16  --   --   --   --   --   --   --   ALKPHOS 74  --  54  --   --   --   --   --   --   --   BILITOT 0.8  --  1.2  --   --   --   --   --   --   --   < > = values in this interval not displayed. ------------------------------------------------------------------------------------------------------------------ estimated creatinine clearance is 45.9 mL/min (by C-G formula based on Cr of 0.88). ------------------------------------------------------------------------------------------------------------------ No  results for input(s): HGBA1C in the last 72 hours. ------------------------------------------------------------------------------------------------------------------ No results for input(s): CHOL, HDL, LDLCALC, TRIG, CHOLHDL, LDLDIRECT in the last 72 hours. ------------------------------------------------------------------------------------------------------------------ No results for input(s): TSH, T4TOTAL, T3FREE, THYROIDAB in the last 72 hours.  Invalid input(s): FREET3 ------------------------------------------------------------------------------------------------------------------ No results for input(s): VITAMINB12, FOLATE, FERRITIN, TIBC, IRON, RETICCTPCT in the last 72 hours.  Coagulation profile  Recent Labs Lab 05/21/2016 1306 05/16/16 0539  INR 1.16 1.23    No results for input(s): DDIMER in the last 72 hours.  Cardiac Enzymes  Recent Labs Lab 05/21/2016 1752 06/02/2016 2325 05/16/16 0539  TROPONINI 0.29* 0.28* 0.19*   ------------------------------------------------------------------------------------------------------------------ Invalid input(s): POCBNP    Assessment & Plan   Patient is a 82 year old African-American female presenting with upper GI bleed  1. AAA with rupture- s/p Placement of a Gore Excluder endoprosthesis.  2. UPPER GI bleeding and anemia due to acute blood loss Could be due to gastritis or ulcer, but not a candidate for EGD this time per Dr. Markham Jordan. Hemoglobin is currently stable. PRBC transfusion prn.   3. A flutter with RVR,  off  IV Cardizem and on cardizem via PEG. Increased to 60 mg Q8H.  4. Sepsis and hypotension Suspect due to abdominal aortic aneurysm rupture, on Zosyn.  Hypotension improved, Improving leukocytosis. F/u CBC.  5. Acute renal failure  improved  6. Elevated troponin due to demand ischemia  7. Severe hypokalemia, replaced and improved.  Hypomagnesemia. Replaced and improved.  * abdominal distension, ielus.   Hold GT feeding, IVF support, per Dr. Michela Pitcher, surgery consult, no  indication for surgery.  * Pulmonary edema. Patient's long sounds crackles, Chest x-ray show pulmonary edema. Start Lasix. Follow-up BMP.  I discussed with pt's another daughter about pt's very poor prognosis and possible palliative care. Per Dr. Orvan Falconer, the patient's daughter agreed to comfort care if condition is worsening. She wants to watch over the weekend.  I discussed with Dr. Orvan Falconer.  8. Code DO NOT RESUSCITATE      Code Status Orders        Start     Ordered   06/09/2016 1718  Do not attempt resuscitation (DNR)   Continuous    Question Answer Comment  In the event of cardiac or respiratory ARREST Do not call a "code blue"   In the event of cardiac or respiratory ARREST Do not perform Intubation, CPR, defibrillation or ACLS   In the event of cardiac or respiratory ARREST Use medication by any route, position, wound care, and other measures to relive pain and suffering. May use oxygen, suction and manual treatment of airway obstruction as needed for comfort.      06/01/2016 1717    Code Status History    Date Active Date Inactive Code Status Order ID Comments User Context   This patient has a current code status but no historical code status.    Advance Directive Documentation        Most Recent Value   Type of Advance Directive  Out of facility DNR (pink MOST or yellow form)   Pre-existing out of facility DNR order (yellow form or pink MOST form)  Yellow form placed in chart (order not valid for inpatient use)   "MOST" Form in Place?             Consults GI, CArdiology  DVT ProphylaxisSCDs  Lab Results  Component Value Date   PLT 102* 05/28/2016     Time Spent in minutes  43  min  Greater than 50% of time spent in care coordination and counseling patient regarding the condition and plan of care.   Shaune Pollack M.D on 05/28/16 at 12:43 PM  Between 7am to 6pm - Pager -  [phone]  After 6pm go to [email or web address] - password [email or web address] [facility]  [facility] [place] [facility]   Office  [phone]

## 2016-06-12 DEATH — deceased

## 2018-01-29 IMAGING — CT CT ABD-PELV W/O CM
2 of 4 series · 16 of 46 positions shown, 18 images · non-contrast
Comparison: January 20, 2015

CLINICAL DATA: Abdomen pain.  Upper GI bleeding.

EXAM:
CT ABDOMEN AND PELVIS WITHOUT CONTRAST
TECHNIQUE: Multidetector CT imaging of the abdomen and pelvis was performed
following the standard protocol without IV contrast.

[Series 2: routine abd pel wo · axial · 0.94mm/px · z∈[-884,-494]mm · 13 of 86 slices shown, 15 images]
[im 4/86  soft-tissue]
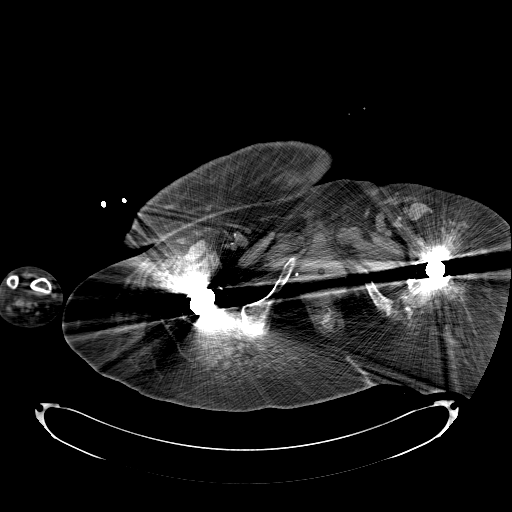
[im 4/86  bone]
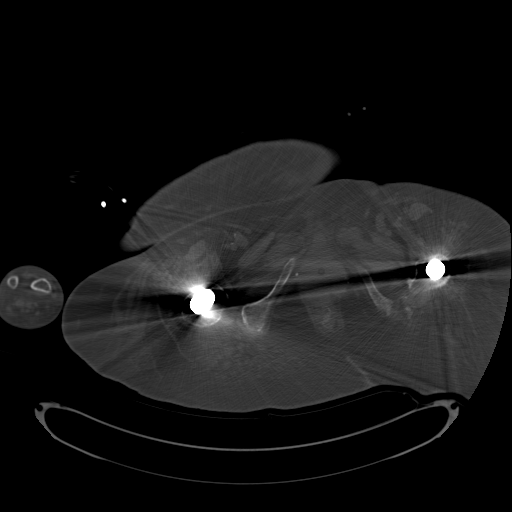
[im 12/86  soft-tissue]
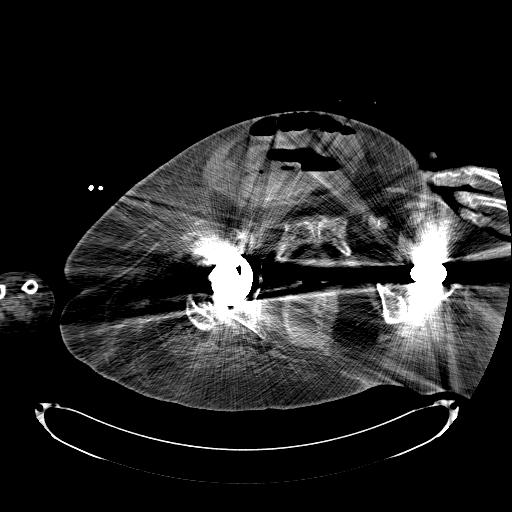
[im 19/86  soft-tissue]
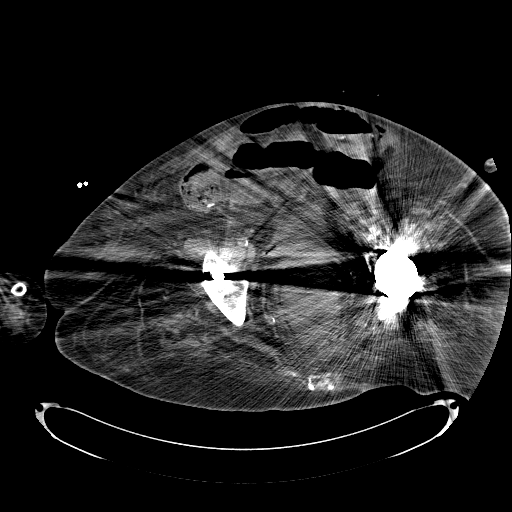
[im 23/86  soft-tissue]
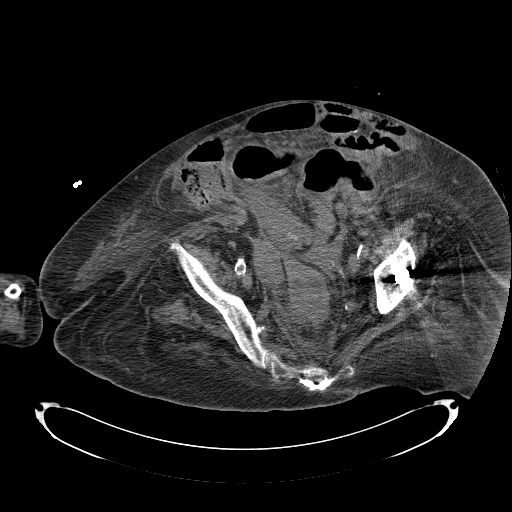
[im 30/86  soft-tissue]
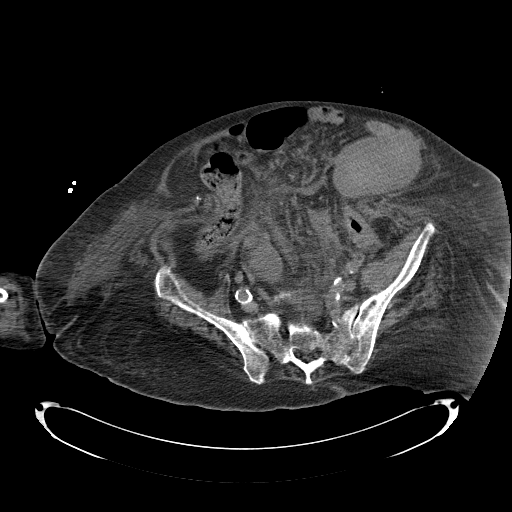
[im 37/86  soft-tissue]
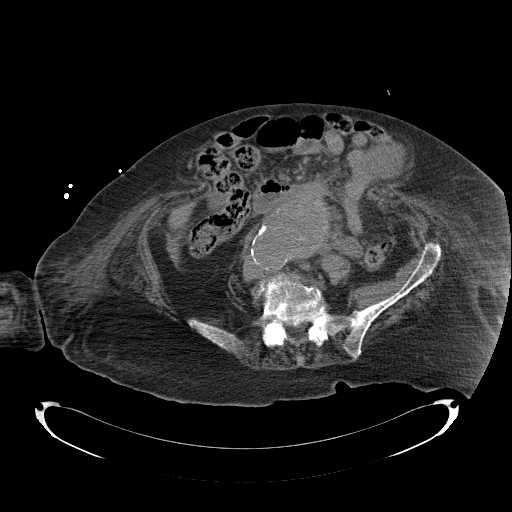
[im 45/86  soft-tissue]
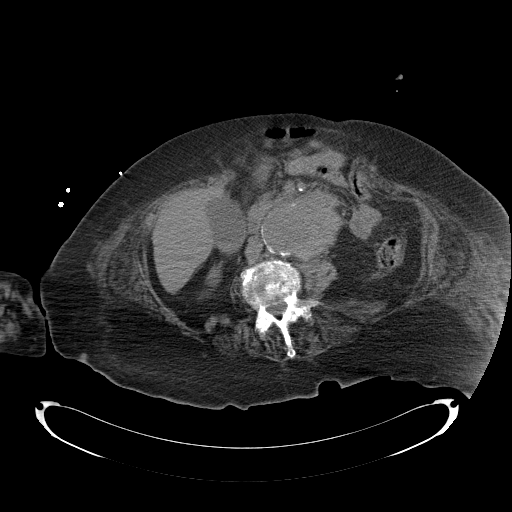
[im 49/86  soft-tissue]
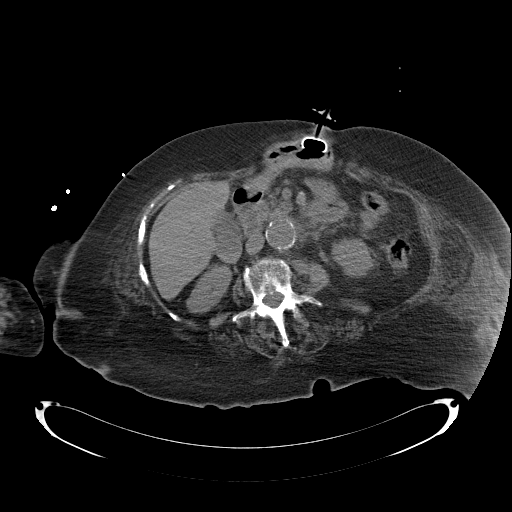
[im 56/86  soft-tissue]
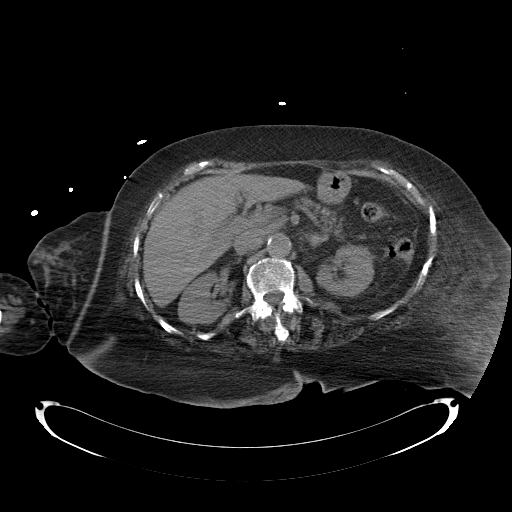
[im 56/86  bone]
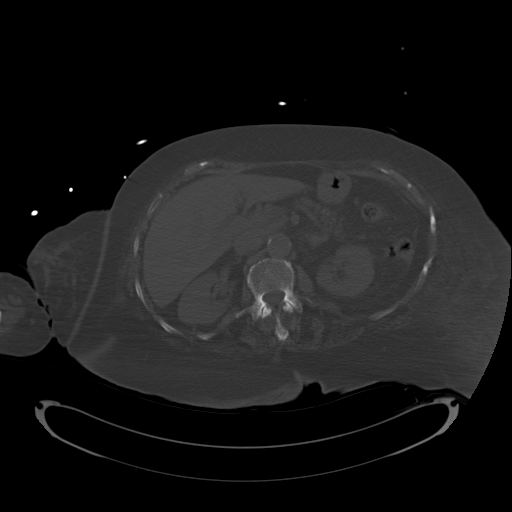
[im 63/86  soft-tissue]
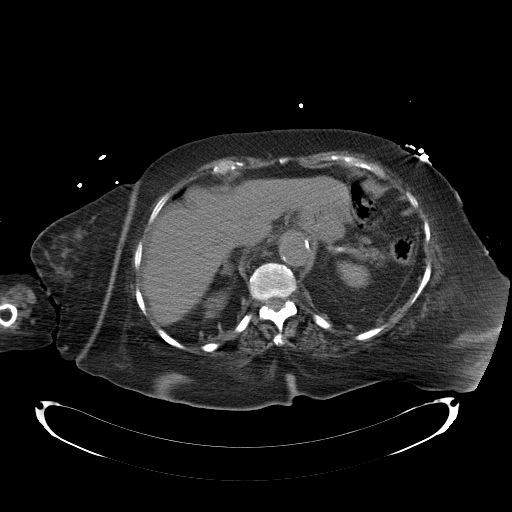
[im 67/86  soft-tissue]
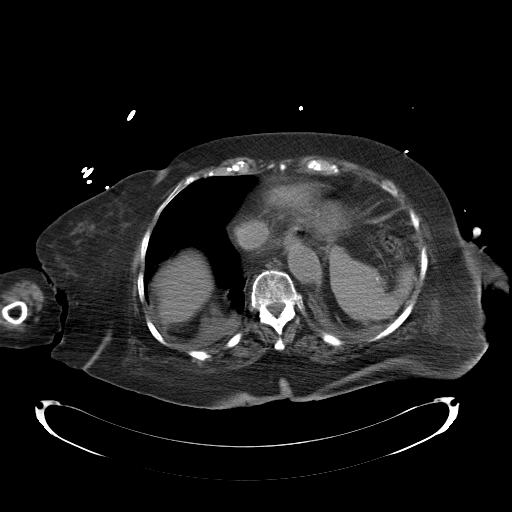
[im 74/86  soft-tissue]
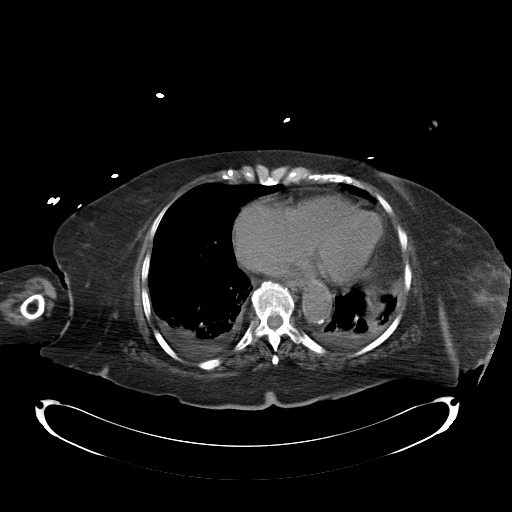
[im 82/86  soft-tissue]
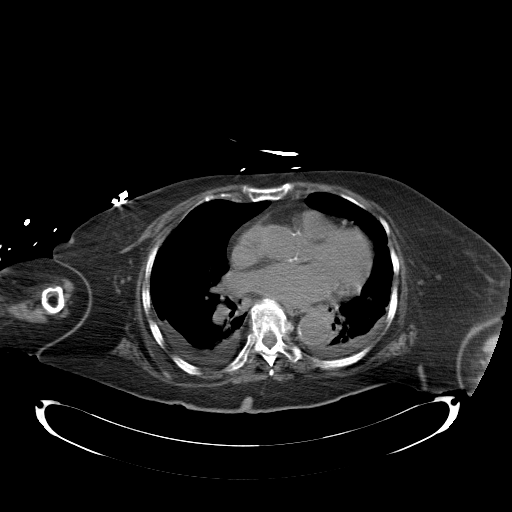

[Series 5: cor routine abd pel wo · coronal · 0.85mm/px · 3 of 143 slices shown]
[im 48/143  soft-tissue]
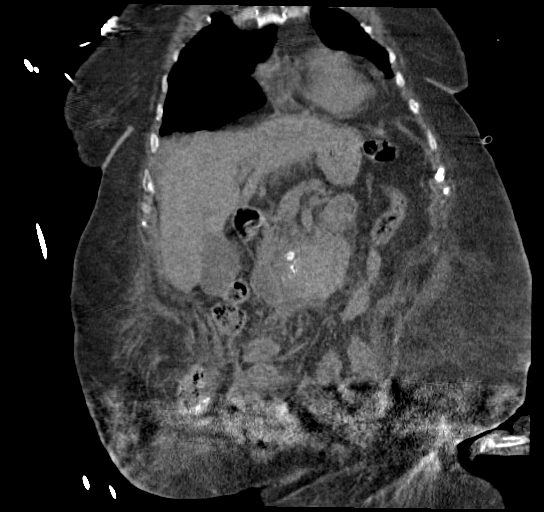
[im 64/143  soft-tissue]
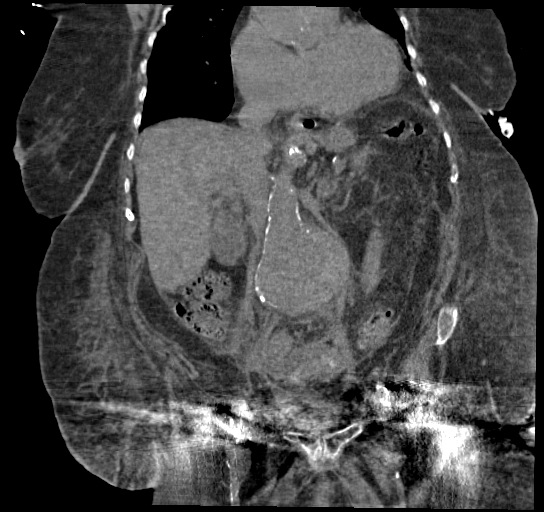
[im 79/143  soft-tissue]
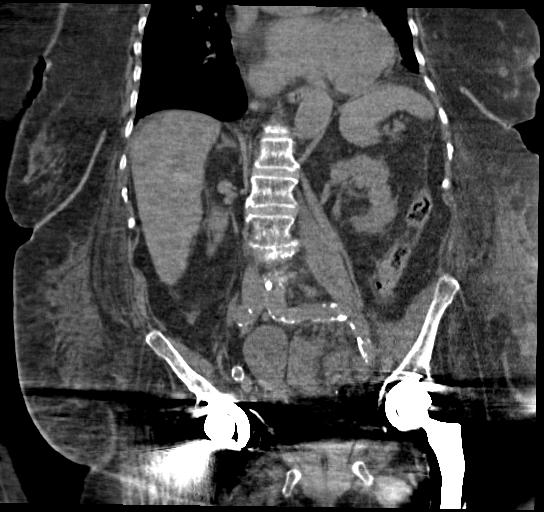

[16 of 46 positions shown; findings below may reference images not displayed]

FINDINGS: Lower chest: The heart has is enlarged. There are small bilateral
pleural effusions. Patchy atelectasis is identified in bilateral
lung bases.

Hepatobiliary: The liver and gallbladder are normal. No mass
visualized on this un-enhanced exam.

Pancreas: No mass or inflammatory process identified on this
un-enhanced exam.

Spleen: Within normal limits in size.

Adrenals/Urinary Tract: The adrenal glands are normal. Right renal
vascular calcification is noted. No evidence of urolithiasis or
hydronephrosis. No definite mass visualized on this un-enhanced
exam. Evaluation of bladder is limited due to metallic artifact from
bilateral hip replacements.

Stomach/Bowel: PEG tube is identified. There is a broad-based
anterior abdominal and pelvic herniation of stomach, bowel loops and
mesenteric fat. There are few mildly dilated small bowel bowel loops
in the anterior pelvis. There is mild diffuse bowel wall thickening
of the sigmoid colon.

Vascular/Lymphatic: There is contained rupture of the infrarenal
abdominal aorta measuring 8.3 x 9 cm. There is fluid/ blood tracking
from the inferior portion of the contained abdominal aortic rupture
extending to a hematoma in the anterior left abdomen measuring 8.9 x
5.4 cm. No pathologically enlarged lymph nodes.

Reproductive: Evaluation is limited due to metallic artifact from
bilateral hip replacements.

Other: None.

Musculoskeletal: Extensive degenerative joint changes are identified
in the spine. Bilateral hip replacements are noted.
IMPRESSION: contained rupture of the infrarenal abdominal aorta measuring 8.3 x
9 cm. There is fluid/ blood tracking from the inferior portion of
the contained abdominal aortic rupture extending to a hematoma in
the anterior left abdomen measuring 8.9 x 5.4 cm.

Mildly dilated small bowel loops in the pelvis with mild diffuse
thick wall sigmoid colon.

Critical Value/emergent results were called by telephone at the time
of interpretation on 05/18/2016 at [DATE] to Dr. Periche, who verbally
acknowledged these results.
